# Patient Record
Sex: Female | Born: 1970 | Hispanic: Yes | Marital: Married
Health system: Midwestern US, Community
[De-identification: ages and names within clinical notes are randomized; demographics above are authoritative.]

## PROBLEM LIST (undated history)

## (undated) DIAGNOSIS — R519 Headache, unspecified: Secondary | ICD-10-CM

## (undated) DIAGNOSIS — N393 Stress incontinence (female) (male): Secondary | ICD-10-CM

## (undated) DIAGNOSIS — T4145XA Adverse effect of unspecified anesthetic, initial encounter: Secondary | ICD-10-CM

## (undated) DIAGNOSIS — T8859XA Other complications of anesthesia, initial encounter: Secondary | ICD-10-CM

## (undated) DIAGNOSIS — K219 Gastro-esophageal reflux disease without esophagitis: Secondary | ICD-10-CM

## (undated) DIAGNOSIS — T7840XA Allergy, unspecified, initial encounter: Secondary | ICD-10-CM

## (undated) DIAGNOSIS — L237 Allergic contact dermatitis due to plants, except food: Secondary | ICD-10-CM

## (undated) DIAGNOSIS — N9489 Other specified conditions associated with female genital organs and menstrual cycle: Secondary | ICD-10-CM

## (undated) DIAGNOSIS — J45909 Unspecified asthma, uncomplicated: Secondary | ICD-10-CM

## (undated) DIAGNOSIS — I10 Essential (primary) hypertension: Secondary | ICD-10-CM

## (undated) DIAGNOSIS — R51 Headache: Secondary | ICD-10-CM

## (undated) DIAGNOSIS — E559 Vitamin D deficiency, unspecified: Secondary | ICD-10-CM

## (undated) HISTORY — DX: Essential (primary) hypertension: I10

## (undated) HISTORY — DX: Unspecified asthma, uncomplicated: J45.909

## (undated) HISTORY — PX: OTHER SURGICAL HISTORY: SHX169

## (undated) HISTORY — DX: Allergy, unspecified, initial encounter: T78.40XA

## (undated) HISTORY — DX: Vitamin D deficiency, unspecified: E55.9

---

## 2001-01-18 ENCOUNTER — Emergency Department (HOSPITAL_COMMUNITY): Admission: EM | Admit: 2001-01-18 | Discharge: 2001-01-18 | Payer: Self-pay

## 2002-07-01 ENCOUNTER — Encounter: Payer: Self-pay | Admitting: Emergency Medicine

## 2002-07-01 ENCOUNTER — Emergency Department (HOSPITAL_COMMUNITY): Admission: EM | Admit: 2002-07-01 | Discharge: 2002-07-01 | Payer: Self-pay | Admitting: Emergency Medicine

## 2005-04-13 ENCOUNTER — Ambulatory Visit (HOSPITAL_COMMUNITY): Admission: RE | Admit: 2005-04-13 | Discharge: 2005-04-13 | Payer: Self-pay | Admitting: *Deleted

## 2005-04-30 ENCOUNTER — Ambulatory Visit (HOSPITAL_COMMUNITY): Admission: RE | Admit: 2005-04-30 | Discharge: 2005-04-30 | Payer: Self-pay | Admitting: *Deleted

## 2005-05-15 ENCOUNTER — Ambulatory Visit: Payer: Self-pay | Admitting: *Deleted

## 2005-06-18 ENCOUNTER — Ambulatory Visit (HOSPITAL_COMMUNITY): Admission: RE | Admit: 2005-06-18 | Discharge: 2005-06-18 | Payer: Self-pay | Admitting: *Deleted

## 2005-09-08 ENCOUNTER — Ambulatory Visit: Payer: Self-pay | Admitting: Obstetrics & Gynecology

## 2005-09-08 ENCOUNTER — Inpatient Hospital Stay (HOSPITAL_COMMUNITY): Admission: RE | Admit: 2005-09-08 | Discharge: 2005-09-11 | Payer: Self-pay | Admitting: *Deleted

## 2006-01-07 ENCOUNTER — Encounter (INDEPENDENT_AMBULATORY_CARE_PROVIDER_SITE_OTHER): Payer: Self-pay | Admitting: Obstetrics & Gynecology

## 2006-01-07 ENCOUNTER — Ambulatory Visit: Payer: Self-pay | Admitting: Obstetrics & Gynecology

## 2006-04-15 ENCOUNTER — Ambulatory Visit: Payer: Self-pay | Admitting: Obstetrics and Gynecology

## 2007-02-23 ENCOUNTER — Encounter: Payer: Self-pay | Admitting: Obstetrics & Gynecology

## 2007-02-23 ENCOUNTER — Ambulatory Visit: Payer: Self-pay | Admitting: Obstetrics and Gynecology

## 2007-08-25 ENCOUNTER — Encounter: Payer: Self-pay | Admitting: Obstetrics and Gynecology

## 2007-08-25 ENCOUNTER — Ambulatory Visit: Payer: Self-pay | Admitting: Obstetrics and Gynecology

## 2008-07-06 HISTORY — PX: LIPOSUCTION TRUNK: SUR833

## 2008-07-06 HISTORY — PX: REDUCTION MAMMAPLASTY: SUR839

## 2010-11-18 NOTE — Group Therapy Note (Signed)
[name], [name] NO.:  [id number]   MEDICAL RECORD NO.:  [id number]          PATIENT TYPE:  WOC   LOCATION:  [facility]                   FACILITY:  [facility]   PHYSICIAN:  Caren Griffins, CNM       DATE OF BIRTH:  [birth year]   DATE OF SERVICE:                                  CLINIC NOTE   REASON FOR VISIT:  Follow up Pap.   HISTORY:  This is a 40 year old G3 P3 who is here for her final q.6-  month Pap followup following an abnormal Pap on March 10, 2005, which  is ASC-H.  She did have a colposcopy May 15, 2005.  See previous  note by Dr. Okey Dupre.  She is concerned today with intermittent vaginal  itching.  She associates this with onset of menses, and also notices it  after intercourse.  It has been intermittent for about the past 5  months.  She denies dyspareunia.  She states the discharge is thick and  white, and of note, last night she did insert Monistat vaginal cream.  This has been ineffective in the past.  LMP was July 26, 2007.  She  has a Mirena IUD, and his happy with that.  She does not plan to have  anymore children.   Her interval surgical, family and medical history are unchanged from  that noted on November 2006.  She is on no other medications.  She does  state that she had A-1 gestational diabetes with her 2nd pregnancy.   PHYSICAL EXAMINATION:  VITAL SIGNS:  Unremarkable, except weight is 159  pounds.  Height is 5 feet 1 inch.  BMI is slightly over 30.  GENERAL:  WNWD NAD.  HEENT:  WNL.  NECK:  Without thyromegaly.  LUNGS:  Clear to auscultation bilaterally.  HEART:  RRR without murmur.  ABDOMEN:  Soft, flat and nontender without masses.  BREASTS:  No discrete masses noted.  No nipple discharge.  No  lymphadenopathy.  EXTREMITIES:  Two plus pulses.  No edema.  PELVIC EXAM:  Tanner 5, NEFG.  There is some thick white discharge, but  this is obscured by the Monistat cream, which is in the vault.  Pap, GC  and chlamydia are done.   Wet prep is done, but difficult to determine if  there are any candidal forms present due to the Monistat cream.   ASSESSMENT:  1. Overweight.  2. History of gestational diabetes.  3. History of abnormal Pap with previous 3 Pap smears normal.  4. Candidal vulva vaginitis.   PLAN:  1. Discussed diet, exercise, weight reduction.  Would suggest coming      in for a 2-hour OGTT at some point after she tries weight loss.  2. Pap smear is sent.  If this one is normal, she will not need      another followup Pap for a year.  3. She was given a prescription for Doxaphene 150 mg to take 1 now and      repeat every other day twice if still needed.           ______________________________  Caren Griffins, CNM  DP/MEDQ  D:  08/25/2007  T:  08/26/2007  Job:  [id number]

## 2010-11-18 NOTE — Group Therapy Note (Signed)
NAMESAROYA, RICCOBONO NO.:  [id number]   MEDICAL RECORD NO.:  [id number]          PATIENT TYPE:  WOC   LOCATION:  [facility]                   FACILITY:  [facility]   PHYSICIAN:  Argentina Donovan, MD        DATE OF BIRTH:  June 04, 1971   DATE OF SERVICE:  02/23/2007                                  CLINIC NOTE   CHIEF COMPLAINT:  Followup abnormal Pap smear.   HPI:  Patient is a 40 year old female who presents for followup history  of abnormal Pap smears.  Of note, patient has a history of azygos in  August of 2005, a normal Pap smear April, 2006, ASC-H in September,  2006, a repeat Pap smear in July of 2007 was within normal limits and  she also had one in October of 2007 that was within normal limits.  After the Pap smear done in September of 2006, which showed an ASC-H she  had a colposcopy with biopsy and endocervical curettage which on biopsy  showed severe erosive cervicitis with florid reactive/recurrent changes  and also severe endocervicitis.  As I just noted, her Pap smears since  then have been within normal limits.  She is here today for a repeat Pap  smear.   PHYSICAL EXAM:  VITAL SIGNS:  Temperature 97.9, pulse 63, blood pressure  123/83, weight 155.6, height 5 feet 1-1/2 inches.  GENERAL:  In no acute distress.  PELVIC:  No external lesions noted, no blood or discharge in the vaginal  vault or coming from the cervical os.  No lesions noted on examination  of cervix.  Patient's IUD strings were noted and appear in the normal  position.  Pap smear was obtained and sent.  Bimanual exam elicited no  cervical motion tenderness or adnexal tenderness.  No masses.   ASSESSMENT AND PLAN:  This is a 40 year old female with history of ASC-  H.  Will repeat every 6 month Pap smears through 2 years and at that  point if they are normal just follow up yearly.  Will follow up the  results of her Pap smear from today.     ______________________________  Argentina Donovan,  MD    ______________________________  Argentina Donovan, MD    PR/MEDQ  D:  02/23/2007  T:  02/24/2007  Job:  [id number]

## 2010-11-21 NOTE — Group Therapy Note (Signed)
NAMEBRENETTA, [name].:  [id number]   MEDICAL RECORD Pruitt.:  [id number]          PATIENT TYPE:  WOC   LOCATION:  [facility]                   FACILITY:  [facility]   PHYSICIAN:  Dorthula Perfect, MD     DATE OF BIRTH:  27-Jun-1971   DATE OF SERVICE:  01/07/2006                                    CLINIC NOTE   HISTORY:  This 40 year old Hispanic female returns for evaluation.  Sometime  in the past, she had an abnormal Pap smear with a question of dysplasia.  She was seen here for colposcopy November 2006, which was performed by Dr.  Lauralee Evener.  His colposcopy note revealed the cervix to appear completely  normal.  Biopsies were apparently taken from 3, 6, and 9 o'clock, all of  which revealed severe erosive surface cervicitis and reparative changes.  The endocervical curettings were benign with severe endocervicitis.  She was  then treated with doxycycline 100 mg b.i.d. for two weeks.  She returns  today for followup.   History is negative.   EXAMINATION:  Examination of the abdomen is completely normal.  Pelvic  examination is likewise normal.  The cervix appears normal with  endocervicitis from about 5 to 7 o'clock.  Pap smear is taken with the  plastic spatula and the Cytobrush was also used.   DISPOSITION:  Severe cervical cervicitis.  Pap smear 2.  The patient is told  that if this Pap smear is normal, that she should have another one in  three months.  Otherwise, if this is abnormal, she will be given  instructions as to what to do next.           ______________________________  Dorthula Perfect, MD     ER/MEDQ  D:  01/07/2006  T:  01/07/2006  Job:  [id number]

## 2010-11-21 NOTE — Op Note (Signed)
[name], [name]           ACCOUNT NO.:  [id number]   MEDICAL RECORD NO.:  [id number]          PATIENT TYPE:  INP   LOCATION:  9143                          [facility]   PHYSICIAN:  [name], [name] DATE OF BIRTH:  09/19/1970   DATE OF PROCEDURE:  09/08/2005  DATE OF DISCHARGE:                                 OPERATIVE REPORT   PREOPERATIVE DIAGNOSES:  1.  Intrauterine pregnancy at 39 weeks.  2.  History of severe laceration with her last delivery that required      surgery and bladder suspension.   POSTOPERATIVE DIAGNOSIS:  1.  Intrauterine pregnancy at 39 weeks.  2.  History of severe laceration with her last delivery that required      surgery and bladder suspension.   PROCEDURE:  Primary low transverse cesarean section via Pfannenstiel.   SURGEON:  [name], [name]   ASSISTANT:  Carolanne Grumbling, [name]   ANESTHESIA:  Spinal.   COMPLICATIONS:  None.   ESTIMATED BLOOD LOSS:  600 mL.   INTRAVENOUS FLUIDS:  3400 mL of lactated Ringer's.   URINE OUTPUT:  70 mL of clear urine.   INDICATIONS:  The patient is a 40 year old G4, P 2-0-1-2 at 44 weeks'  gestational age dated by LMP who presents for scheduled primary cesarean  section.  With the patient's second vaginal delivery she had what sounds to  be a fourth degree laceration that required vaginal surgery, and she also  suffered from incontinence that required bladder suspension.  The patient  had been counseled that she should not have another vaginal delivery.  The  patient was once again counseled on risks and benefits of cesarean section.  It was stressed the patient that even with her having cesarean section that  does not ensure that she will not have trouble with incontinence. The  patient expressed comprehension and wanted to proceed with the surgery.   FINDINGS:  Viable female infant in the cephalic presentation with Apgars of  9/9, weight 7 pounds 5 ounces.  Normal uterus with a small,  approximately 1  cm anterior fibroid, normal tubes and ovaries.   DESCRIPTION OF PROCEDURE:  The patient was taken to the operating room where  spinal anesthesia was placed and found to be adequate.  She was then prepped  and draped in the normal sterile fashion in the dorsal supine position with  a leftward tilt.  A Pfannenstiel skin incision was then made with the  scalpel and carried through to the underlying layer of fascia.  The fascia  was nicked in the midline and incision extended laterally with the Mayo  scissors.  This inferior aspect of the fascial incision was then grasped  with Kocher clamps, elevating the underlying rectus muscles dissected off  bluntly.  Attention was then turned to the superior aspect of the incision  which, in a similar fashion, was grasped, tented up with Kocher clamps and  the rectus muscles dissected off bluntly. The rectus muscles were separated  in midline, and the peritoneum identified, tented up and bluntly entered and  the incision was excised with Metzenbaum scissors with  good visualization of  the bladder. Bladder blade was inserted, and the vesicouterine perineum  identified, grasped with pickups and entered sharply with Metzenbaum  scissors.  Incision was extended laterally and a bladder flap created  digitally.   Bladder blade was then reinserted and the lower uterine segment incised in  transverse fashion with the scalpel.  The uterine incision was extended  laterally with the bandage scissors.  Bladder blade was removed.  The  infant's head was through delivered atraumatically.  Nose and mouth were  bulb suctioned and the cord clamped and cut. Infant was handed off to  waiting neonatologist.   Placenta was then removed manually, and the uterus exteriorized and cleared  of all clot and debris.  The uterine incision was repaired with O Vicryl in  a running locked fashion. There was a minimal amount of bleeding from the  right angle so a  figure-of-eight was placed, and excellent hemostasis was  obtained.  The uterus was put back in the abdominal cavity and the gutters  were irrigated.  The fascia was reapproximated with O Vicryl in  a running  fashion. The skin was closed with staples.   The patient tolerated the procedure well. Sponge, lap and needle counts were  correct x2. Ancef was given at cord clamp. The patient taken recovery room  in stable condition     ______________________________  [name], [name]    ______________________________  [name], [name]    TLW/MEDQ  D:  09/08/2005  T:  09/09/2005  Job:  [id number]

## 2010-11-21 NOTE — Discharge Summary (Signed)
[name], [name]           ACCOUNT NO.:  [id number]   MEDICAL RECORD NO.:  [id number]          PATIENT TYPE:  INP   LOCATION:  9143                          [facility]   PHYSICIAN:  [name], [name] DATE OF BIRTH:  1971-04-02   DATE OF ADMISSION:  09/08/2005  DATE OF DISCHARGE:  09/11/2005                                 DISCHARGE SUMMARY   DISCHARGE DIAGNOSES:  Low transverse cesarean section.   DISCHARGE MEDICATIONS:  1.  Ibuprofen 600 mg q.6h. p.r.n. pain.  2.  Percocet 5/325 mg one tablet q.6h. p.r.n. pain.  3.  Colace one tablet b.i.d. p.r.n. constipation.  4.  Prenatal vitamins one tablet daily while breast-feeding.   OPERATION/PROCEDURE:  Primary low transverse cesarean section performed on  September 08, 2005.  Please see op note for details.   LABORATORY DATA:  Postoperative CBC on September 09, 2005 showed WBC 12.5,  hemoglobin 11.4, hematocrit 33.3, and platelets 154,000.   HOSPITAL COURSE:  Patient is a 40 year old G4, P2-0-1-2 who presented at  term for a primary elective low transverse cesarean section on September 08, 2005.  Her surgery was performed by Dr. Penne Lash and Dr. Mayford Knife and  produced a viable 7 pound 5 ounce female with Apgars of 9 at one minute and  9 at five minutes (please see op note for details of cesarean section).  Following her procedure the patient was transferred to the floor where she  did quite well.  Her pain was adequately controlled with ibuprofen and  Percocet.  She decided she was going to breast-feed her little girl and for  contraception she would have an IUD placed at six weeks ___________  possibility of pregnancy prior to her IUD insertion at six weeks and that  she was going to engage in sexual intercourse she needed another form of  birth control.  The patient expressed understanding of these risks and  stated she would wait until her IUD insertion at six weeks to start sexual  intercourse.  Patient's blood type was O+ so a  vaccine was not treated prior  to discharge.  The patient continued to ___________ about complication.  Her  staples were removed prior to discharge without complications at six weeks  at [facility].  The patient was discharged in stable condition.  Of  note, the infant was delivered at 9:54 a.m. on the morning of September 08, 2005  and placenta was delivered at 9:55 on the morning of September 08, 2005.      Whitney Post, [name]    ______________________________  [name], [name]    KF/MEDQ  D:  09/11/2005  T:  09/11/2005  Job:  [id number]

## 2012-06-07 ENCOUNTER — Emergency Department (HOSPITAL_BASED_OUTPATIENT_CLINIC_OR_DEPARTMENT_OTHER)
Admission: EM | Admit: 2012-06-07 | Discharge: 2012-06-07 | Disposition: A | Discharge status: Home / Self Care | Payer: Medicaid Other | Attending: Emergency Medicine | Admitting: Emergency Medicine

## 2012-06-07 ENCOUNTER — Encounter (HOSPITAL_BASED_OUTPATIENT_CLINIC_OR_DEPARTMENT_OTHER): Payer: Self-pay | Admitting: *Deleted

## 2012-06-07 DIAGNOSIS — N949 Unspecified condition associated with female genital organs and menstrual cycle: Secondary | ICD-10-CM | POA: Insufficient documentation

## 2012-06-07 DIAGNOSIS — N760 Acute vaginitis: Secondary | ICD-10-CM

## 2012-06-07 DIAGNOSIS — N938 Other specified abnormal uterine and vaginal bleeding: Secondary | ICD-10-CM | POA: Insufficient documentation

## 2012-06-07 DIAGNOSIS — B9689 Other specified bacterial agents as the cause of diseases classified elsewhere: Secondary | ICD-10-CM

## 2012-06-07 DIAGNOSIS — R109 Unspecified abdominal pain: Secondary | ICD-10-CM | POA: Insufficient documentation

## 2012-06-07 LAB — CBC
HCT: 34.9 % — ABNORMAL LOW (ref 36.0–46.0)
Hemoglobin: 12 g/dL (ref 12.0–15.0)
MCH: 29.7 pg (ref 26.0–34.0)
MCHC: 34.4 g/dL (ref 30.0–36.0)
MCV: 86.4 fL (ref 78.0–100.0)
Platelets: 200 10*3/uL (ref 150–400)
RBC: 4.04 MIL/uL (ref 3.87–5.11)
RDW: 12.5 % (ref 11.5–15.5)
WBC: 9.2 10*3/uL (ref 4.0–10.5)

## 2012-06-07 LAB — URINALYSIS, ROUTINE W REFLEX MICROSCOPIC
Bilirubin Urine: NEGATIVE
Glucose, UA: NEGATIVE mg/dL
Ketones, ur: NEGATIVE mg/dL
Leukocytes, UA: NEGATIVE
Nitrite: NEGATIVE
Protein, ur: NEGATIVE mg/dL
Specific Gravity, Urine: 1.024 (ref 1.005–1.030)
Urobilinogen, UA: 1 mg/dL (ref 0.0–1.0)
pH: 5.5 (ref 5.0–8.0)

## 2012-06-07 LAB — URINE MICROSCOPIC-ADD ON

## 2012-06-07 LAB — WET PREP, GENITAL
Trich, Wet Prep: NONE SEEN
Yeast Wet Prep HPF POC: NONE SEEN

## 2012-06-07 LAB — PREGNANCY, URINE: Preg Test, Ur: NEGATIVE

## 2012-06-07 MED ORDER — METRONIDAZOLE 500 MG PO TABS: 500.0000 mg | ORAL_TABLET | Freq: Two times a day (BID) | ORAL | Status: DC

## 2012-06-07 NOTE — ED Notes (Signed)
6 month history of "period problems" states has more lower abdominal pain than before and flow changes some times barely spots sometimes bleeds more heavily and they have become more irregular. States she has an IUD that she has had for 6 years. Reports a "heavy feeling" in pelvic area

## 2012-06-07 NOTE — ED Provider Notes (Signed)
History     CSN: [id number]  Arrival date & time 06/07/12  1014   First MD Initiated Contact with Patient 06/07/12 1019      Chief Complaint  Patient presents with  . Menstrual Problem    (Consider location/radiation/quality/duration/timing/severity/associated sxs/prior treatment) The history is provided by the patient.  pt c/o irregular periods for past 5-6 months. States has had iud for past 6 years. Previously periods would last 3-4 days. Regular bleeding for 3 days, then taper off. In past 6 months, bleeding is irregular during periods, spotting at times, at other times heavy bleeding. On period now, v light bleeding. In past couple days notes cramping across lower abdomen bilaterally. No constant, focal pain. No back or flank pain. No dysuria, urgency or frequency. No fever or chills. Only prior abd surgery is c section. No abd distension. No vomiting. Having normal bms. Denies fever or chills. No vaginal discharge. Denies hx ovarian cysts, endometriosis or uterine fibroids.   History reviewed. No pertinent past medical history.  History reviewed. No pertinent past surgical history.  History reviewed. No pertinent family history.  History  Substance Use Topics  . Smoking status: Never Smoker   . Smokeless tobacco: Not on file  . Alcohol Use: No    OB History    Grav Para Term Preterm Abortions TAB SAB Ect Mult Living                  Review of Systems  Constitutional: Negative for fever and chills.  HENT: Negative for neck pain.   Eyes: Negative for redness.  Respiratory: Negative for shortness of breath.   Cardiovascular: Negative for chest pain.  Gastrointestinal: Positive for abdominal pain. Negative for vomiting, diarrhea and constipation.  Genitourinary: Negative for flank pain.  Musculoskeletal: Negative for back pain.  Skin: Negative for rash.  Neurological: Negative for headaches.  Hematological: Does not bruise/bleed easily.  Psychiatric/Behavioral:  Negative for confusion.    Allergies  Review of patient's allergies indicates no known allergies.  Home Medications  No current outpatient prescriptions on file.  BP 141/91  Pulse 79  Temp 98.2 F (36.8 C) (Oral)  Resp 20  SpO2 100%  LMP 06/07/2012  Physical Exam  Nursing note and vitals reviewed. Constitutional: She appears well-developed and well-nourished. No distress.  Eyes: Conjunctivae normal are normal. No scleral icterus.  Neck: Neck supple. No tracheal deviation present.  Cardiovascular: Normal rate.   Pulmonary/Chest: Effort normal. No respiratory distress.  Abdominal: Soft. Normal appearance and bowel sounds are normal. She exhibits no distension and no mass. There is no tenderness. There is no rebound and no guarding.       No hernias.   Genitourinary:       No cva tenderness. Small amount dark blood in vagina. No active bleeding. Cervix closed. iud string noted. No cmt, no focal adx masses or tenderness.   Musculoskeletal: She exhibits no edema.  Neurological: She is alert.  Skin: Skin is warm and dry. No rash noted.  Psychiatric: She has a normal mood and affect.    ED Course  Procedures (including critical care time)   Labs Reviewed  URINALYSIS, ROUTINE W REFLEX MICROSCOPIC  PREGNANCY, URINE  CBC   Results for orders placed during the hospital encounter of 06/07/12  URINALYSIS, ROUTINE W REFLEX MICROSCOPIC      Component Value Range   Color, Urine YELLOW  YELLOW   APPearance CLEAR  CLEAR   Specific Gravity, Urine 1.024  1.005 - 1.030  pH 5.5  5.0 - 8.0   Glucose, UA NEGATIVE  NEGATIVE mg/dL   Hgb urine dipstick LARGE (*) NEGATIVE   Bilirubin Urine NEGATIVE  NEGATIVE   Ketones, ur NEGATIVE  NEGATIVE mg/dL   Protein, ur NEGATIVE  NEGATIVE mg/dL   Urobilinogen, UA 1.0  0.0 - 1.0 mg/dL   Nitrite NEGATIVE  NEGATIVE   Leukocytes, UA NEGATIVE  NEGATIVE  PREGNANCY, URINE      Component Value Range   Preg Test, Ur NEGATIVE  NEGATIVE  CBC       Component Value Range   WBC 9.2  4.0 - 10.5 K/uL   RBC 4.04  3.87 - 5.11 MIL/uL   Hemoglobin 12.0  12.0 - 15.0 g/dL   HCT 44.0 (*) 10.2 - 72.5 %   MCV 86.4  78.0 - 100.0 fL   MCH 29.7  26.0 - 34.0 pg   MCHC 34.4  30.0 - 36.0 g/dL   RDW 36.6  44.0 - 34.7 %   Platelets 200  150 - 400 K/uL  WET PREP, GENITAL      Component Value Range   Yeast Wet Prep HPF POC NONE SEEN  NONE SEEN   Trich, Wet Prep NONE SEEN  NONE SEEN   Clue Cells Wet Prep HPF POC FEW (*) NONE SEEN   WBC, Wet Prep HPF POC FEW (*) NONE SEEN  URINE MICROSCOPIC-ADD ON      Component Value Range   Squamous Epithelial / LPF RARE  RARE   WBC, UA 0-2  <3 WBC/hpf   RBC / HPF 21-50  <3 RBC/hpf   Bacteria, UA FEW (*) RARE   Urine-Other MUCOUS PRESENT         MDM  Labs.   Reviewed nursing notes and prior charts for additional history.   Clue cells noted on wet prep, will rx flagyl.  hgb 12.  No active bleeding. Pt states has used 3 pads per day with current period.  abd soft nt.   Benign abd exam, afeb.  Pt appears stable for d/c.  Notes symptoms, issues w irregular periods/bleeding for 6 months - will refer to gyn follow up.            Suzi Roots, MD 06/07/12 1130

## 2012-06-08 LAB — GC/CHLAMYDIA PROBE AMP
CT Probe RNA: NEGATIVE
GC Probe RNA: NEGATIVE

## 2012-07-07 ENCOUNTER — Encounter: Payer: Self-pay | Admitting: Obstetrics & Gynecology

## 2012-07-07 ENCOUNTER — Ambulatory Visit (INDEPENDENT_AMBULATORY_CARE_PROVIDER_SITE_OTHER): Payer: Medicaid Other | Admitting: Obstetrics & Gynecology

## 2012-07-07 VITALS — BP 141/91 | HR 80 | Temp 97.5°F | Ht 61.0 in | Wt 150.6 lb

## 2012-07-07 DIAGNOSIS — Z3009 Encounter for other general counseling and advice on contraception: Secondary | ICD-10-CM

## 2012-07-07 DIAGNOSIS — N920 Excessive and frequent menstruation with regular cycle: Secondary | ICD-10-CM

## 2012-07-07 NOTE — Patient Instructions (Addendum)
Laparoscopic Tubal Ligation Laparoscopic tubal ligation is a procedure that closes the fallopian tubes at a time other than right after childbirth. By closing the fallopian tubes, the eggs that are released from the ovaries cannot enter the uterus and sperm cannot reach the egg. Tubal ligation is also known as getting your "tubes tied." Tubal ligation is done so you will not be able to get pregnant or have a baby.  Although this procedure may be reversed, it should be considered permanent and irreversible. If you want to have future pregnancies, you should not have this procedure.  LET YOUR CAREGIVER KNOW ABOUT:  Allergies to food or medicine.  Medicines taken, including vitamins, herbs, eyedrops, over-the-counter medicines, and creams.  Use of steroids (by mouth or creams).  Previous problems with numbing medicines.  History of bleeding problems or blood clots.  Any recent colds or infections.  Previous surgery.  Other health problems, including diabetes and kidney problems.  Possibility of pregnancy, if this applies.  Any past pregnancies. RISKS AND COMPLICATIONS   Infection.  Bleeding.  Injury to surrounding organs.  Anesthetic side effects.  Failure of the procedure.  Ectopic pregnancy.  Future regret about having the procedure done. BEFORE THE PROCEDURE  Do not take aspirin or blood thinners a week before the procedure or as directed. This can cause bleeding.  Do not eat or drink anything 6 to 8 hours before the procedure. PROCEDURE   You may be given a medicine to help you relax (sedative) before the procedure. You will be given a medicine to make you sleep (general anesthetic) during the procedure.  A tube will be put down your throat to help your breath while under general anesthesia.  Two small cuts (incisions) are made in the lower abdominal area and near the belly button.  Your abdominal area will be inflated with a safe gas (carbon dioxide). This helps  give the surgeon room to operate, visualize, and helps the surgeon avoid other organs.  A thin, lighted tube (laparoscope) with a camera attached is inserted into your abdomen through one of the incisions near the belly button. Other small instruments are also inserted through the other abdominal incision.  The fallopian tubes are located and are either blocked with a ring, clip, or are burned (cauterized).  After the fallopian tubes are blocked, the gas is released from the abdomen.  The incisions will be closed with stitches (sutures), and a bandage may be placed over the incisions. AFTER THE PROCEDURE   You will rest in a recovery room for 1 4 hours until you are stable and doing well.  You will also have some mild abdominal discomfort for 3 7 days. You will be given pain medicine to ease any discomfort.  As long as there are no problems, you may be allowed to go home. Someone will need to drive you home and be with you for at least 24 hours once home.  You may have some mild discomfort in the throat. This is from the tube placed in your throat while you were sleeping.  You may experience discomfort in the shoulder area from some trapped air between the liver and diaphragm. This sensation is normal and will slowly go away on its own. Document Released: 09/28/2000 Document Revised: 12/22/2011 Document Reviewed: 10/03/2011 [employer] Patient Information 2013 [employer].  

## 2012-07-07 NOTE — Progress Notes (Signed)
Patient ID: [name], female   DOB: [birth year], 42 y.o.   MRN: [id number]  Chief Complaint  Patient presents with  . Follow-up  . Metrorrhagia    HPI [name] is a 42 y.o. female.  [id number] Patient's last menstrual period was 07/01/2012. Increased menstrual flow and pain for the last 6 months. Paragard in place since 09/2005. No abnormal discharge or intermenstrual spotting. Menses last for 7 days, become heavy for 1 day. Wants to have tubal ligation, doesn't want another IUD or pills. HPI  Past Medical History  Diagnosis Date  . Asthma     Past Surgical History  Procedure Date  . Cesarean section   . Breast surgery 2010    breast reduction  . Liposuction trunk 2010    Family History  Problem Relation Age of Onset  . Heart disease Father   . Diabetes Sister   . Hypertension Sister   . Hypertension Brother     Social History History  Substance Use Topics  . Smoking status: Never Smoker   . Smokeless tobacco: Not on file  . Alcohol Use: No    No Known Allergies  No current outpatient prescriptions on file.    Review of Systems Review of Systems  Constitutional: Negative for appetite change.  Gastrointestinal: Positive for nausea (with pain). Negative for diarrhea and constipation.  Genitourinary: Positive for vaginal bleeding and menstrual problem. Negative for vaginal discharge, vaginal pain and dyspareunia.    Blood pressure 141/91, pulse 80, temperature 97.5 F (36.4 C), temperature source Oral, height 5\' 1"  (1.549 m), weight 150 lb 9.6 oz (68.312 kg), last menstrual period 07/01/2012.  Physical Exam Physical Exam  Constitutional: She appears well-developed and well-nourished. No distress.  Pulmonary/Chest: Effort normal. No respiratory distress.  Abdominal: Soft. She exhibits no distension and no mass.  Genitourinary: Vagina normal and uterus normal.       Paragard string visible 2-3 cm, no tenderness  Skin: Skin is warm and dry.    Psychiatric: She has a normal mood and affect. Her behavior is normal.    Data Reviewed ED visit  Assessment    Menorrhagia and dysmenorrhea with Paragard, desires sterilization    Plan    Sign Medicaid paper  Schedule LBTL. Procedure and risks explained and questions answered. Remove IUD in OR       [name] 07/07/2012, 4:27 PM

## 2012-07-12 ENCOUNTER — Other Ambulatory Visit: Payer: Self-pay | Admitting: Obstetrics & Gynecology

## 2012-08-16 ENCOUNTER — Encounter (HOSPITAL_COMMUNITY): Payer: Self-pay

## 2012-08-16 ENCOUNTER — Encounter (HOSPITAL_COMMUNITY)
Admission: RE | Admit: 2012-08-16 | Discharge: 2012-08-16 | Disposition: A | Discharge status: Home / Self Care | Payer: Medicaid Other | Source: Ambulatory Visit | Attending: Obstetrics & Gynecology | Admitting: Obstetrics & Gynecology

## 2012-08-16 DIAGNOSIS — N920 Excessive and frequent menstruation with regular cycle: Secondary | ICD-10-CM | POA: Diagnosis not present

## 2012-08-16 DIAGNOSIS — N946 Dysmenorrhea, unspecified: Secondary | ICD-10-CM | POA: Diagnosis not present

## 2012-08-16 DIAGNOSIS — Z302 Encounter for sterilization: Secondary | ICD-10-CM | POA: Diagnosis present

## 2012-08-16 DIAGNOSIS — Z30432 Encounter for removal of intrauterine contraceptive device: Secondary | ICD-10-CM | POA: Diagnosis not present

## 2012-08-16 LAB — CBC
HCT: 37.6 % (ref 36.0–46.0)
Hemoglobin: 12.4 g/dL (ref 12.0–15.0)
MCH: 29.2 pg (ref 26.0–34.0)
MCHC: 33 g/dL (ref 30.0–36.0)
MCV: 88.7 fL (ref 78.0–100.0)
Platelets: 221 10*3/uL (ref 150–400)
RBC: 4.24 MIL/uL (ref 3.87–5.11)
RDW: 12.8 % (ref 11.5–15.5)
WBC: 8.9 10*3/uL (ref 4.0–10.5)

## 2012-08-16 LAB — SURGICAL PCR SCREEN
MRSA, PCR: NEGATIVE
Staphylococcus aureus: NEGATIVE

## 2012-08-16 NOTE — Patient Instructions (Addendum)
Your procedure is scheduled on:08/22/12  Enter through the Main Entrance at :0930 am Pick up desk phone and dial [phone] and inform us of your arrival.  Please call [phone] if you have any problems the morning of surgery.  Remember: Do not eat or drink after midnight:SUNDAY   Take these meds the morning of surgery with a sip of water:none  DO NOT wear jewelry, eye make-up, lipstick,body lotion, or dark fingernail polish. Do not shave for 48 hours prior to surgery.  If you are to be admitted after surgery, leave suitcase in car until your room has been assigned. Patients discharged on the day of surgery will not be allowed to drive home.

## 2012-08-22 ENCOUNTER — Ambulatory Visit (HOSPITAL_COMMUNITY): Payer: Medicaid Other | Admitting: Anesthesiology

## 2012-08-22 ENCOUNTER — Encounter (HOSPITAL_COMMUNITY): Payer: Self-pay | Admitting: Anesthesiology

## 2012-08-22 ENCOUNTER — Encounter (HOSPITAL_COMMUNITY)
Admission: RE | Disposition: A | Discharge status: Home / Self Care | Payer: Self-pay | Source: Ambulatory Visit | Attending: Obstetrics & Gynecology

## 2012-08-22 ENCOUNTER — Ambulatory Visit (HOSPITAL_COMMUNITY)
Admission: RE | Admit: 2012-08-22 | Discharge: 2012-08-22 | Disposition: A | Discharge status: Home / Self Care | Payer: Medicaid Other | Source: Ambulatory Visit | Attending: Obstetrics & Gynecology | Admitting: Obstetrics & Gynecology

## 2012-08-22 DIAGNOSIS — Z30432 Encounter for removal of intrauterine contraceptive device: Secondary | ICD-10-CM | POA: Diagnosis not present

## 2012-08-22 DIAGNOSIS — N920 Excessive and frequent menstruation with regular cycle: Secondary | ICD-10-CM | POA: Insufficient documentation

## 2012-08-22 DIAGNOSIS — N946 Dysmenorrhea, unspecified: Secondary | ICD-10-CM | POA: Insufficient documentation

## 2012-08-22 DIAGNOSIS — Z302 Encounter for sterilization: Secondary | ICD-10-CM | POA: Insufficient documentation

## 2012-08-22 HISTORY — PX: LAPAROSCOPIC TUBAL LIGATION: SHX1937

## 2012-08-22 LAB — PREGNANCY, URINE: Preg Test, Ur: NEGATIVE

## 2012-08-22 SURGERY — LIGATION, FALLOPIAN TUBE, LAPAROSCOPIC
Anesthesia: General | Site: Abdomen | Laterality: Bilateral | Wound class: Clean Contaminated

## 2012-08-22 MED ORDER — DEXAMETHASONE SODIUM PHOSPHATE 10 MG/ML IJ SOLN
INTRAMUSCULAR | Status: AC
  Filled 2012-08-22: qty 1

## 2012-08-22 MED ORDER — NEOSTIGMINE METHYLSULFATE 1 MG/ML IJ SOLN
INTRAMUSCULAR | Status: AC
  Filled 2012-08-22: qty 1

## 2012-08-22 MED ORDER — GLYCOPYRROLATE 0.2 MG/ML IJ SOLN
INTRAMUSCULAR | Status: DC | PRN
  Administered 2012-08-22: 0.4 mg via INTRAVENOUS

## 2012-08-22 MED ORDER — ROCURONIUM BROMIDE 50 MG/5ML IV SOLN
INTRAVENOUS | Status: AC
  Filled 2012-08-22: qty 1

## 2012-08-22 MED ORDER — LACTATED RINGERS IV SOLN
INTRAVENOUS | Status: DC
  Administered 2012-08-22 (×2): via INTRAVENOUS

## 2012-08-22 MED ORDER — MEPERIDINE HCL 25 MG/ML IJ SOLN: 6.2500 mg | INTRAMUSCULAR | Status: DC | PRN

## 2012-08-22 MED ORDER — MIDAZOLAM HCL 2 MG/2ML IJ SOLN: 0.5000 mg | Freq: Once | INTRAMUSCULAR | Status: DC | PRN

## 2012-08-22 MED ORDER — KETOROLAC TROMETHAMINE 30 MG/ML IJ SOLN
INTRAMUSCULAR | Status: AC
  Filled 2012-08-22: qty 1

## 2012-08-22 MED ORDER — ONDANSETRON HCL 4 MG/2ML IJ SOLN
INTRAMUSCULAR | Status: DC | PRN
  Administered 2012-08-22: 4 mg via INTRAVENOUS

## 2012-08-22 MED ORDER — LIDOCAINE HCL (CARDIAC) 20 MG/ML IV SOLN
INTRAVENOUS | Status: DC | PRN
  Administered 2012-08-22 (×2): 30 mg via INTRAVENOUS

## 2012-08-22 MED ORDER — KETOROLAC TROMETHAMINE 30 MG/ML IJ SOLN: 15.0000 mg | Freq: Once | INTRAMUSCULAR | Status: DC | PRN

## 2012-08-22 MED ORDER — FENTANYL CITRATE 0.05 MG/ML IJ SOLN
INTRAMUSCULAR | Status: AC
  Administered 2012-08-22: 50 ug via INTRAVENOUS
  Filled 2012-08-22: qty 2

## 2012-08-22 MED ORDER — HYDROCODONE-ACETAMINOPHEN 5-500 MG PO TABS: 1.0000 | ORAL_TABLET | Freq: Four times a day (QID) | ORAL | Status: DC | PRN

## 2012-08-22 MED ORDER — FENTANYL CITRATE 0.05 MG/ML IJ SOLN
INTRAMUSCULAR | Status: DC | PRN
  Administered 2012-08-22 (×5): 50 ug via INTRAVENOUS

## 2012-08-22 MED ORDER — DEXAMETHASONE SODIUM PHOSPHATE 4 MG/ML IJ SOLN
INTRAMUSCULAR | Status: DC | PRN
  Administered 2012-08-22: 10 mg via INTRAVENOUS

## 2012-08-22 MED ORDER — LACTATED RINGERS IV SOLN
INTRAVENOUS | Status: DC
  Administered 2012-08-22: 13:00:00 via INTRAVENOUS

## 2012-08-22 MED ORDER — PROPOFOL 10 MG/ML IV EMUL
INTRAVENOUS | Status: AC
  Filled 2012-08-22: qty 20

## 2012-08-22 MED ORDER — ONDANSETRON HCL 4 MG/2ML IJ SOLN
INTRAMUSCULAR | Status: AC
  Filled 2012-08-22: qty 2

## 2012-08-22 MED ORDER — MIDAZOLAM HCL 5 MG/5ML IJ SOLN
INTRAMUSCULAR | Status: DC | PRN
  Administered 2012-08-22: 2 mg via INTRAVENOUS

## 2012-08-22 MED ORDER — LIDOCAINE HCL (CARDIAC) 20 MG/ML IV SOLN
INTRAVENOUS | Status: AC
  Filled 2012-08-22: qty 5

## 2012-08-22 MED ORDER — BUPIVACAINE HCL (PF) 0.25 % IJ SOLN
INTRAMUSCULAR | Status: DC | PRN
  Administered 2012-08-22: 10 mL

## 2012-08-22 MED ORDER — ROCURONIUM BROMIDE 100 MG/10ML IV SOLN
INTRAVENOUS | Status: DC | PRN
  Administered 2012-08-22: 25 mg via INTRAVENOUS

## 2012-08-22 MED ORDER — FENTANYL CITRATE 0.05 MG/ML IJ SOLN
INTRAMUSCULAR | Status: AC
  Filled 2012-08-22: qty 5

## 2012-08-22 MED ORDER — NEOSTIGMINE METHYLSULFATE 1 MG/ML IJ SOLN
INTRAMUSCULAR | Status: DC | PRN
  Administered 2012-08-22: 2 mg via INTRAVENOUS

## 2012-08-22 MED ORDER — GLYCOPYRROLATE 0.2 MG/ML IJ SOLN
INTRAMUSCULAR | Status: AC
  Filled 2012-08-22: qty 2

## 2012-08-22 MED ORDER — BUPIVACAINE HCL (PF) 0.25 % IJ SOLN
INTRAMUSCULAR | Status: AC
  Filled 2012-08-22: qty 30

## 2012-08-22 MED ORDER — FENTANYL CITRATE 0.05 MG/ML IJ SOLN
25.0000 ug | INTRAMUSCULAR | Status: DC | PRN
  Administered 2012-08-22: 50 ug via INTRAVENOUS

## 2012-08-22 MED ORDER — PROMETHAZINE HCL 25 MG/ML IJ SOLN: 6.2500 mg | INTRAMUSCULAR | Status: DC | PRN

## 2012-08-22 MED ORDER — KETOROLAC TROMETHAMINE 30 MG/ML IJ SOLN
INTRAMUSCULAR | Status: DC | PRN
  Administered 2012-08-22 (×2): 30 mg via INTRAVENOUS

## 2012-08-22 MED ORDER — PROPOFOL 10 MG/ML IV EMUL
INTRAVENOUS | Status: DC | PRN
  Administered 2012-08-22: 160 mg via INTRAVENOUS

## 2012-08-22 MED ORDER — MIDAZOLAM HCL 2 MG/2ML IJ SOLN
INTRAMUSCULAR | Status: AC
  Filled 2012-08-22: qty 2

## 2012-08-22 SURGICAL SUPPLY — 21 items
BENZOIN TINCTURE PRP APPL 2/3 (GAUZE/BANDAGES/DRESSINGS) IMPLANT
CATH ROBINSON RED A/P 16FR (CATHETERS) ×2 IMPLANT
CHLORAPREP W/TINT 26ML (MISCELLANEOUS) ×2 IMPLANT
CLIP FILSHIE TUBAL LIGA STRL (Clip) ×4 IMPLANT
CLOTH BEACON ORANGE TIMEOUT ST (SAFETY) ×2 IMPLANT
DERMABOND ADHESIVE PROPEN (GAUZE/BANDAGES/DRESSINGS) ×1
DERMABOND ADVANCED .7 DNX6 (GAUZE/BANDAGES/DRESSINGS) ×1 IMPLANT
GLOVE BIO SURGEON STRL SZ 6.5 (GLOVE) ×2 IMPLANT
GLOVE BIOGEL PI IND STRL 7.0 (GLOVE) ×2 IMPLANT
GLOVE BIOGEL PI INDICATOR 7.0 (GLOVE) ×2
GOWN PREVENTION PLUS LG XLONG (DISPOSABLE) ×4 IMPLANT
NEEDLE INSUFFLATION 120MM (ENDOMECHANICALS) ×2 IMPLANT
PACK LAPAROSCOPY BASIN (CUSTOM PROCEDURE TRAY) ×2 IMPLANT
SET IRRIG TUBING LAPAROSCOPIC (IRRIGATION / IRRIGATOR) IMPLANT
SUT VICRYL 0 UR6 27IN ABS (SUTURE) ×2 IMPLANT
SUT VICRYL 4-0 PS2 18IN ABS (SUTURE) ×2 IMPLANT
TOWEL OR 17X24 6PK STRL BLUE (TOWEL DISPOSABLE) ×4 IMPLANT
TROCAR BALLN 12MMX100 BLUNT (TROCAR) ×2 IMPLANT
TROCAR XCEL DIL TIP R 11M (ENDOMECHANICALS) ×2 IMPLANT
WARMER LAPAROSCOPE (MISCELLANEOUS) ×2 IMPLANT
WATER STERILE IRR 1000ML POUR (IV SOLUTION) ×2 IMPLANT

## 2012-08-22 NOTE — Anesthesia Preprocedure Evaluation (Signed)
Anesthesia Evaluation  Patient identified by MRN, date of birth, ID band Patient awake    Reviewed: Allergy & Precautions, H&P , Patient's Chart, lab work & pertinent test results, reviewed documented beta blocker date and time   History of Anesthesia Complications Negative for: history of anesthetic complications  Airway Mallampati: II TM Distance: >3 FB Neck ROM: full    Dental no notable dental hx.    Pulmonary neg pulmonary ROS, asthma ,  breath sounds clear to auscultation  Pulmonary exam normal       Cardiovascular Exercise Tolerance: Good negative cardio ROS  Rhythm:regular Rate:Normal     Neuro/Psych negative neurological ROS  negative psych ROS   GI/Hepatic negative GI ROS, Neg liver ROS,   Endo/Other  negative endocrine ROS  Renal/GU negative Renal ROS     Musculoskeletal   Abdominal   Peds  Hematology negative hematology ROS (+)   Anesthesia Other Findings   Reproductive/Obstetrics negative OB ROS                           Anesthesia Physical Anesthesia Plan  ASA: II  Anesthesia Plan: General ETT   Post-op Pain Management:    Induction:   Airway Management Planned:   Additional Equipment:   Intra-op Plan:   Post-operative Plan:   Informed Consent: I have reviewed the patients History and Physical, chart, labs and discussed the procedure including the risks, benefits and alternatives for the proposed anesthesia with the patient or authorized representative who has indicated his/her understanding and acceptance.   Dental Advisory Given  Plan Discussed with: CRNA and Surgeon  Anesthesia Plan Comments:         Anesthesia Quick Evaluation

## 2012-08-22 NOTE — Transfer of Care (Signed)
Immediate Anesthesia Transfer of Care Note  Patient: [name]  Procedure(s) Performed: Procedure(s) with comments: LAPAROSCOPIC TUBAL LIGATION (Bilateral) - IUD removal  Patient Location: PACU  Anesthesia Type:General  Level of Consciousness: awake, alert  and oriented  Airway & Oxygen Therapy: Patient Spontanous Breathing and Patient connected to nasal cannula oxygen  Post-op Assessment: Report given to PACU RN and Post -op Vital signs reviewed and stable  Post vital signs: Reviewed and stable  Complications: No apparent anesthesia complications

## 2012-08-22 NOTE — Op Note (Signed)
Procedure: Laparoscopic bilateral tubal ligation with Filshie clips, removal of ParaGard intrauterine device Preoperative diagnosis: Undesired fertility with intrauterine device in place Postoperative diagnosis: Tubal sterilization, left adnexal adhesions Surgeon: Dr. Scheryl Darter Anesthesia: Gen. endotracheal Specimens: None Drains: None Complications: None Counts: Correct   Gave written consent for removal of her intrauterine device and bilateral tubal ligation. The procedure was scheduled to be performed laparoscopically. Patient identification was confirmed and she was brought to the operating room and general anesthesia was induced. His placed in dorsal lithotomy position. Abdomen perineum and vagina were sterilely prepped and draped. The bladder was drained with red rubber catheter. A Hulka tenaculum was placed on the cervix. Quarter percent Marcaine was infiltrated at the umbilicus. 11 blade was used to make a transverse infraumbilical skin incision site of previous scars secondary to abdominoplasty. In place the Veress needle blood proper placement cannot be assured. The fascia and peritoneum were opened with curved Mayo scissors and the [facility] trocar was placed. Pneumoperitoneum was achieved. Laparoscope was inserted with video camera and use. Some of the abdominal wall adhesions. The right tube and ovary appeared normal. The left tube and ovary had chronic adhesions. Could be identified on the left. A Filshie clip was placed with good positioning seen on the right fallopian tube. The left tube was identified and and Filshie clip was placed in the proximal portion with good positioning seen. Elected not to perform extensive lysis adhesions on the left. All instruments were removed the pneumoperitoneum was relieved. Fascia was closed with 0 Vicryl. Skin was closed with interrupted sutures with 4-0 Vicryl and Dermabond was applied. Patient tolerated the procedure well without complications. She was  brought to condition to PACU.  Dr. Scheryl Darter 08/22/2012 12:33 PM

## 2012-08-22 NOTE — H&P (Signed)
  Patient ID: [name], female DOB: [birth year], 42 y.o. MRN: [id number]  Chief Complaint   Patient presents with   .  Follow-up   .  Metrorrhagia   HPI  [name] is a 42 y.o. female. [id number]  LMP 3 weeks ago  Increased menstrual flow and pain for the last 6 months. Paragard in place since 09/2005. No abnormal discharge or intermenstrual spotting. Menses last for 7 days, become heavy for 1 day. Wants to have tubal ligation, doesn't want another IUD or pills. The risk of failure, ectopic, bleeding, infection, pain, visceral organ damage was discussed and her questions were answered. HPI  Past Medical History   Diagnosis  Date   .  Asthma     Past Surgical History   Procedure  Date   .  Cesarean section    .  Breast surgery  2010     breast reduction   .  Liposuction trunk  2010    Family History   Problem  Relation  Age of Onset   .  Heart disease  Father    .  Diabetes  Sister    .  Hypertension  Sister    .  Hypertension  Brother    Social History  History   Substance Use Topics   .  Smoking status:  Never Smoker   .  Smokeless tobacco:  Not on file   .  Alcohol Use:  No   No Known Allergies  No current outpatient prescriptions on file.   Review of Systems  Review of Systems  Constitutional: Negative for appetite change.  Gastrointestinal: Positive for nausea (with pain). Negative for diarrhea and constipation.  Genitourinary: Positive for vaginal bleeding and menstrual problem. Negative for vaginal discharge, vaginal pain and dyspareunia.  Filed Vitals:   08/15/12 1531  Height: 5\' 1"  (1.549 m)  Weight: 150 lb (68.04 kg)    Physical Exam  Physical Exam  Constitutional: She appears well-developed and well-nourished. No distress.  Pulmonary/Chest: Effort normal. No respiratory distress.  Abdominal: Soft. She exhibits no distension and no mass.  Genitourinary: Vagina normal and uterus normal.  Paragard string visible 2-3 cm, no tenderness  Skin: Skin  is warm and dry.  Psychiatric: She has a normal mood and affect. Her behavior is normal.  Data Reviewed  ED visit  Assessment  Menorrhagia and dysmenorrhea with Paragard, desires sterilization  Plan  Sign Medicaid paper  Schedule LBTL. Procedure and risks explained and questions answered. Remove IUD in OR  [place] Giammona  08/22/2012

## 2012-08-22 NOTE — Anesthesia Postprocedure Evaluation (Signed)
  Anesthesia Post Note  Patient: [name]  Procedure(s) Performed: Procedure(s) (LRB): LAPAROSCOPIC TUBAL LIGATION (Bilateral)  Anesthesia type: GA  Patient location: PACU  Post pain: Pain level controlled  Post assessment: Post-op Vital signs reviewed  Last Vitals:  Filed Vitals:   08/22/12 1004  BP: 131/80  Pulse: 62  Temp: 36.7 C  Resp: 18    Post vital signs: Reviewed  Level of consciousness: sedated  Complications: No apparent anesthesia complications

## 2012-08-23 ENCOUNTER — Encounter (HOSPITAL_COMMUNITY): Payer: Self-pay | Admitting: Obstetrics & Gynecology

## 2012-09-14 ENCOUNTER — Encounter: Payer: Self-pay | Admitting: Obstetrics & Gynecology

## 2012-09-14 ENCOUNTER — Ambulatory Visit (INDEPENDENT_AMBULATORY_CARE_PROVIDER_SITE_OTHER): Payer: Medicaid Other | Admitting: Obstetrics & Gynecology

## 2012-09-14 VITALS — BP 139/80 | HR 58 | Temp 97.2°F | Ht 61.0 in | Wt 150.6 lb

## 2012-09-14 DIAGNOSIS — Z09 Encounter for follow-up examination after completed treatment for conditions other than malignant neoplasm: Secondary | ICD-10-CM

## 2012-09-14 NOTE — Progress Notes (Signed)
Patient ID: Jacalyn Lefevre, female   DOB: February 01, 1971, 42 y.o.   MRN: [id number] Subjective:     JOSETTA WIGAL is a 42 y.o. female who presents to the clinic 3 weeks status post laparoscopy for requested sterilization. Eating a regular diet without difficulty. Bowel movements are normal. The patient is not having any pain.  The following portions of the patient's history were reviewed and updated as appropriate: allergies, current medications, past family history, past medical history, past social history, past surgical history and problem list.  Review of Systems Pertinent items are noted in HPI.    Objective:    BP 139/80  Pulse 58  Temp(Src) 97.2 F (36.2 C) (Oral)  Ht 5\' 1"  (1.549 m)  Wt 150 lb 9.6 oz (68.312 kg)  BMI 28.47 kg/m2  LMP 08/29/2012 General:  alert and cooperative  Abdomen: soft, bowel sounds active, non-tender  Incision:   healing well, no drainage, no erythema, no hernia, no seroma, no swelling, no dehiscence, incision well approximated     Assessment:    Doing well postoperatively. Operative findings again reviewed.   Plan:    1. Continue any current medications. 2. Wound care discussed. 3. Activity restrictions: none 4. Anticipated return to work: now. 5. Follow up: 6 mo for well woman exam.  ARNOLD,JAMES 09/14/2012

## 2012-09-14 NOTE — Patient Instructions (Signed)
Laparoscopic Tubal Ligation Laparoscopic tubal ligation is a procedure that closes the fallopian tubes at a time other than right after childbirth. By closing the fallopian tubes, the eggs that are released from the ovaries cannot enter the uterus and sperm cannot reach the egg. Tubal ligation is also known as getting your "tubes tied." Tubal ligation is done so you will not be able to get pregnant or have a baby.  Although this procedure may be reversed, it should be considered permanent and irreversible. If you want to have future pregnancies, you should not have this procedure.  LET YOUR CAREGIVER KNOW ABOUT:  Allergies to food or medicine.  Medicines taken, including vitamins, herbs, eyedrops, over-the-counter medicines, and creams.  Use of steroids (by mouth or creams).  Previous problems with numbing medicines.  History of bleeding problems or blood clots.  Any recent colds or infections.  Previous surgery.  Other health problems, including diabetes and kidney problems.  Possibility of pregnancy, if this applies.  Any past pregnancies. RISKS AND COMPLICATIONS   Infection.  Bleeding.  Injury to surrounding organs.  Anesthetic side effects.  Failure of the procedure.  Ectopic pregnancy.  Future regret about having the procedure done. BEFORE THE PROCEDURE  Do not take aspirin or blood thinners a week before the procedure or as directed. This can cause bleeding.  Do not eat or drink anything 6 to 8 hours before the procedure. PROCEDURE   You may be given a medicine to help you relax (sedative) before the procedure. You will be given a medicine to make you sleep (general anesthetic) during the procedure.  A tube will be put down your throat to help your breath while under general anesthesia.  Two small cuts (incisions) are made in the lower abdominal area and near the belly button.  Your abdominal area will be inflated with a safe gas (carbon dioxide). This helps  give the surgeon room to operate, visualize, and helps the surgeon avoid other organs.  A thin, lighted tube (laparoscope) with a camera attached is inserted into your abdomen through one of the incisions near the belly button. Other small instruments are also inserted through the other abdominal incision.  The fallopian tubes are located and are either blocked with a ring, clip, or are burned (cauterized).  After the fallopian tubes are blocked, the gas is released from the abdomen.  The incisions will be closed with stitches (sutures), and a bandage may be placed over the incisions. AFTER THE PROCEDURE   You will rest in a recovery room for 1 4 hours until you are stable and doing well.  You will also have some mild abdominal discomfort for 3 7 days. You will be given pain medicine to ease any discomfort.  As long as there are no problems, you may be allowed to go home. Someone will need to drive you home and be with you for at least 24 hours once home.  You may have some mild discomfort in the throat. This is from the tube placed in your throat while you were sleeping.  You may experience discomfort in the shoulder area from some trapped air between the liver and diaphragm. This sensation is normal and will slowly go away on its own. Document Released: 09/28/2000 Document Revised: 12/22/2011 Document Reviewed: 10/03/2011 [employer] Patient Information 2013 [employer].  

## 2014-05-07 ENCOUNTER — Encounter: Payer: Self-pay | Admitting: Obstetrics & Gynecology

## 2016-05-08 ENCOUNTER — Encounter: Payer: Self-pay | Admitting: Gynecology

## 2016-05-08 ENCOUNTER — Ambulatory Visit (INDEPENDENT_AMBULATORY_CARE_PROVIDER_SITE_OTHER): Payer: BLUE CROSS/BLUE SHIELD | Admitting: Gynecology

## 2016-05-08 VITALS — BP 130/80 | Ht 61.0 in | Wt 154.0 lb

## 2016-05-08 DIAGNOSIS — G44001 Cluster headache syndrome, unspecified, intractable: Secondary | ICD-10-CM

## 2016-05-08 DIAGNOSIS — M2550 Pain in unspecified joint: Secondary | ICD-10-CM | POA: Diagnosis not present

## 2016-05-08 DIAGNOSIS — N951 Menopausal and female climacteric states: Secondary | ICD-10-CM

## 2016-05-08 DIAGNOSIS — R102 Pelvic and perineal pain: Secondary | ICD-10-CM | POA: Diagnosis not present

## 2016-05-08 DIAGNOSIS — M6289 Other specified disorders of muscle: Secondary | ICD-10-CM | POA: Diagnosis not present

## 2016-05-08 DIAGNOSIS — Z01411 Encounter for gynecological examination (general) (routine) with abnormal findings: Secondary | ICD-10-CM | POA: Diagnosis not present

## 2016-05-08 DIAGNOSIS — N914 Secondary oligomenorrhea: Secondary | ICD-10-CM

## 2016-05-08 NOTE — Progress Notes (Signed)
[name] 01-Dec-1970 [id number]   History:    45 y.o.  for annual gyn exam who has not been seen by a medical provider in several years. Patient had an abdominal plasty and breast reduction in [place] many years ago. Patient with multiple complaints today as follows:  #1 patient compliance of tiredness and muscle fatigue throughout the day #2 patient complaining some joint discomfort during the day but wears office today progresses #3 patient complaining of time skipping periods #4 patient complaining of occasional hot flashes mood swings and irritability #5 patient complains of cluster headaches to come and go at different times #6 patient complains at times of initial disturbance #7 on and off lower abdominal pelvic discomfort  Patient has 2 vaginal deliveries one cesarean section the last resulting with a tubal ligation.  Patient stated that she had been informed the past that she had ovarian cyst. Patient refused flu vaccine today  Past medical history,surgical history, family history and social history were all reviewed and documented in the EPIC chart.  Gynecologic History Patient's last menstrual period was 05/06/2016. Contraception: tubal ligation Last Pap: Several years ago. Results were: normal Last mammogram: 2015. Results were: normal  Obstetric History OB History  Gravida Para Term Preterm AB Living  4 3 3  0 1 3  SAB TAB Ectopic Multiple Live Births  0 1 0 0      # Outcome Date GA Lbr Len/2nd Weight Sex Delivery Anes PTL Lv  4 TAB           3 Term           2 Term           1 Term                ROS: A ROS was performed and pertinent positives and negatives are included in the history.  GENERAL: No fevers or chills. HEENT: No change in vision, no earache, sore throat or sinus congestion. NECK: No pain or stiffness. CARDIOVASCULAR: No chest pain or pressure. No palpitations. PULMONARY: No shortness of breath, cough or wheeze.  GASTROINTESTINAL: No abdominal pain, nausea, vomiting or diarrhea, melena or bright red blood per rectum. GENITOURINARY: No urinary frequency, urgency, hesitancy or dysuria. MUSCULOSKELETAL: No joint or muscle pain, no back pain, no recent trauma. DERMATOLOGIC: No rash, no itching, no lesions. ENDOCRINE: No polyuria, polydipsia, no heat or cold intolerance. No recent change in weight. HEMATOLOGICAL: No anemia or easy bruising or bleeding. NEUROLOGIC: No headache, seizures, numbness, tingling or weakness. PSYCHIATRIC: No depression, no loss of interest in normal activity or change in sleep pattern.     Exam: chaperone present  BP 130/80   Ht 5\' 1"  (1.549 m)   Wt 154 lb (69.9 kg)   LMP 05/06/2016   BMI 29.10 kg/m   Body mass index is 29.1 kg/m.  General appearance : Well developed well nourished female. No acute distress HEENT: Eyes: no retinal hemorrhage or exudates,  Neck supple, trachea midline, no carotid bruits, no thyroidmegaly Lungs: Clear to auscultation, no rhonchi or wheezes, or rib retractions  Heart: Regular rate and rhythm, no murmurs or gallops Breast:Examined in sitting and supine position were symmetrical in appearance, no palpable masses or tenderness,  no skin retraction, no nipple inversion, no nipple discharge, no skin discoloration, no axillary or supraclavicular lymphadenopathy Abdomen: no palpable masses or tenderness, no rebound or guarding Extremities: no edema or skin discoloration or tenderness  Pelvic:  Bartholin, Urethra, Skene Glands:  Within normal limits             Vagina: No gross lesions or discharge  Cervix: No gross lesions or discharge  Uterus  anteverted, normal size, shape and consistency, non-tender and mobile  Adnexa  Without masses or tenderness  Anus and perineum  normal   Rectovaginal  normal sphincter tone without palpated masses or tenderness             Hemoccult not indicated     Assessment/Plan:  45 y.o. female for annual exam patient  will be referred to the neurologist for further evaluation on her headaches. Patient will return back to the office next week for fasting blood work consisting of a comprehensive metabolic panel, fasting lipid profile, TSH, CBC, and urinalysis. Because of her muscle tiredness and fatigue will check a vitamin D level and because her joint discomfort we'll going to check her rheumatoid factor. She will also return the following week for an ultrasound to better assess her adnexa. Because of her vasomotor symptoms and FSH will be drawn. Patient refused the flu vaccine today. We discussed importance of monthly breast exams. We discussed importance of calcium vitamin D and weightbearing exercise for osteoporosis prevention. Literature information on the menopause and hormone replacement therapy was provided also that we will discuss to the time of her ultrasound what her labs are back.   Terrance Mass MD, 4:45 PM 05/08/2016

## 2016-05-08 NOTE — Patient Instructions (Signed)
Terapia de reemplazo hormonal (Hormone Therapy) En la menopausia, su cuerpo comienza a producir menos estrgeno y Immunologist. Esto provoca que el cuerpo deje de tener perodos [place]. Esto se debe a que el estrgeno y la progesterona controlan sus perodos y su ciclo menstrual. [name] falta de estrgeno puede causar sntomas tales como:  Social research officer, government.  Sequedad vaginal  Piel seca.  Prdida del deseo sexual.  Riesgo de prdida de hueso (osteoporosis). Cuando esto ocurre, puede elegir realizar [name] terapia hormonal para volver a Clinical research associate estrgeno perdido [employer]. Cuando slo se introduce esta hormona, el procedimiento se conoce normalmente como TRE (terapia de reemplazo de [place]). Cuando la hormona progestina se combina con el estrgeno, el procedimiento se conoce normalmente como TH (terapia hormonal). Esto es lo que previamente se conoca como terapia de reemplazo hormonal (TRH). El profesional que le asiste le ayudar a tomar una decisin acerca de lo que resulte lo mejor para usted. La decisin de realizar una TRH cambia a menudo debido a que se Risk manager. Muchos estudios no ponen de acuerdo con respecto a los beneficios de Optometrist una terapia de reemplazo hormonal.  BENEFICIOS PROBABLES DE LA TRH QUE INCLUYEN PROTECCIN CONTRA:  Golpes de calor - Un golpe de calor es la sensacin repentina de calor sobre la cara y el cuerpo. La piel enrojece, como al sonrojarse. Estn asociados con la transpiracin y los trastornos del sueo. Las mujeres que atraviesan la menopausia pueden tener golpes de calor unas pocas veces en el mes o varios al da; esto depende de la mujer.  Osteoporosis (prdida de hueso) - El estrgeno ayuda a protegerse contra la prdida de [place]. Luego de la [place], los huesos de una mujer pierden calcio y se vuelven frgiles y Publishing rights manager. Como resultado, es ms probable que el hueso se [place]. Los que resultan afectados con mayor  frecuencia son los de la cadera, la [place] y la columna vertebral. La terapia hormonal puede ayudar a retardar la prdida de hueso luego de la menopausia. Realizar ejercicios con peso y tomar calcio con vitamina D tambin puede ayudar a prevenir la prdida de [place]. Existen medicamentos que puede prescribir el profesional que la asiste para ayudar a prevenir la osteoporosis.  Sequedad vaginal - La prdida de estrgeno produce cambios en la vagina. El recubrimiento de la misma puede volverse fino y Education officer, museum. Estos cambios pueden causar dolor y [place]. La sequedad tambin puede producir una infeccin. Puede ocasionarle ardor y [place].  Las infecciones en las vas urinarias son ms comunes luego de la menopausia debido a la falta de Oceanographer.  Otros beneficios posibles del estrgeno incluyen un cambio positivo en el humor y en la memoria de corto plazo en las mujeres. EFECTOS SECUNDARIOS Y RIESGOS  Utilizar estrgeno slo sin progesterona causa que el recubrimiento del tero crezca. Esto aumenta el riesgo de cncer endometrial. El profesional que la asiste deber darle otra hormona llamada progestina, si usted tiene tero.  Las mujeres que realizan una TH combinada (estrgeno y progestina) parecen tener un mayor riesgo de sufrir cncer de mama. El riesgo parece ser [place], [place] aumenta a lo largo del tiempo que se realice la [place].  La terapia combinada tambin hace que el tejido mamario sea levemente ms denso, lo que hace que sea ms difcil leer mamografas (radiografas de mama).  Combinada, la terapia de estrgeno y Immunologist puede realizarse todos los das, en cuyo caso podrn Warehouse manager de [place]. La TH puede realizarse de  manera cclica, en cuyo caso tendr perodos menstruales.  La TH puede aumentar el riesgo de [place], ataque cardaco, cncer de mama y formacin de cogulos en la pierna.  El estrgeno transdrmico (estrgeno que se absorbe a Designer, jewellery  de la piel mediante el uso de un parche o una crema) puede tener mejores resultados en los siguientes casos:  Colesterol.  La presin arterial.  Cogulos sanguneos. La presencia de estas afecciones puede indicar que no debe realizarse terapia hormonal:  Cncer de endometrio.  Enfermedad heptica.  Cncer de mama.  Cardiopata.  Antecedentes de cogulos sanguneos.  Ictus. TRATAMIENTO  Si decide realizar [name] TH y tiene tero, normalmente se prescribe el uso de estrgeno y progestina.  El profesional que la asiste le ayudar a decidir la mejor forma de [employer].  [employer] formas posibles de administracin de [place], se incluyen las siguientes:  Pldoras.  Parches.  Geles.  Aerosoles.  Crema, anillos y vulos vaginales con estrgeno.  Lo mejor es Chief Executive Officer dosis posible que pueda ayudarla con sus sntomas y tomarlos durante la menor cantidad de tiempo posible.  La terapia hormonal puede ayudar a Banker de los problemas (sntomas) que afectan a las mujeres durante la menopausia. Antes de tomar una decisin con respecto a la TH, converse con el profesional que la asiste acerca de qu es lo mejor para usted. Mantngase bien informada y sintase cmoda con sus decisiones. INSTRUCCIONES PARA EL CUIDADO DOMICILIARIO:  [place] indicaciones del profesional con respecto a cmo Biomedical scientist.  Se realiza una prueba de Papanicolaou para detectar el cncer de cuello del tero.  El primer Papanicolaou debe realizarse a los 21 aos.  La prueba se repite cada 2 aos entre los 21 y los 29 aos.  Despus de los 30 aos, debe realizarse una prueba de Papanicolaou cada 3 aos siempre que los 3 estudios anteriores hayan sido normales.  Algunas mujeres presentan problemas mdicos que aumentan la probabilidad de Museum/gallery curator cncer de cuello del tero. Consulte a su mdico acerca de estos problemas. Es muy importante que le informe a su mdico si  aparecen nuevos problemas poco despus de su ltimo Papanicolaou. En estos casos, el mdico podr indicar que se realice el Papanicolaou con ms frecuencia.  Las [employer] son las mismas para las mujeres que hayan recibido o no la vacuna para el VPH (virus del papiloma humano).  Si le han realizado una histerectoma por un problema que no era cncer o una afeccin que pudiera causar cncer, ya no necesitar un Papanicolaou. Sin embargo, aunque ya no necesite hacerse un Papanicolaou, es una buena idea hacerse un examen regularmente para asegurarse de que no haya otros problemas.  Si tiene entre 65 y 29 aos y ha tenido [employer] estudios de Papanicolaou en los ltimos 10 aos, ya no ser IT sales professional. Sin embargo, aunque ya no necesite hacerse un Papanicolaou, es una buena idea hacerse un examen regularmente para asegurarse de que no haya otros problemas.  Si ha recibido un tratamiento para Science writer cervical o una enfermedad que podra causar cncer, necesitar realizarse una prueba de Papanicolaou y controles durante al menos 45 aos de concluido el [place].  Si no se ha [place] [employer] con regularidad, Chief Executive Officer a [employer] factores de riesgo (como el tener un nuevo compaero sexual) para Teacher, adult education si es necesario que se los haga de [place].  Es posible que algunas mujeres deban realizarse exmenes de deteccin con mayor frecuencia  si presentan un alto riesgo de padecer cncer de cuello del tero.  Hgase controles de [place] regular, e incluya Papanicolau y [place]. SOLICITE ATENCIN MDICA DE INMEDIATO SI PRESENTA:  Hemorragia vaginal anormal.  Dolor o inflamacin en las piernas, falta de aliento o Tourist information centre manager.  Mareos o dolores de [place].  Protuberancias o cambios en sus mamas o axilas.  Pronunciacin inarticulada.  Debilidad o adormecimiento en los brazos o las piernas.  Dolor, ardor o sangrado al [employer].  Dolor  abdominal.   Esta informacin no tiene como fin reemplazar el consejo del mdico. Asegrese de hacerle al mdico cualquier pregunta que tenga.   Document Released: 12/09/2007 Document Revised: 11/06/2014 Elsevier Interactive Patient Education 2016 [place] menopausia (Menopause) La menopausia es el perodo normal de la vida en el cual los perodos menstruales cesan por completo. Se completa cuando no se tienen 12 perodos menstruales consecutivos. Ocurre generalmente en mujeres entre los 40 y los 61 aos. Muy rara vez una mujer tiene la menopausia antes de los 40 [place]. En la menopausia, los ovarios dejan de producir las hormonas femeninas estrgeno y Immunologist. Esto puede causar sntomas indeseables y Technical brewer a Technical sales engineer. A veces los sntomas aparecen entre 4 y 5 aos antes de que comience la menopausia. No existe una relacin [employer] y:  [place] anticonceptivos orales.  La cantidad de hijos que tuvo.  La raza.  La edad en que comenzaron los perodos menstruales (menarca). Las mujeres que fuman mucho y las que son muy delgadas pueden desarrollar la menopausia a una edad ms temprana. CAUSAS  Los ovarios dejan de producir las hormonas femeninas estrgeno y Immunologist.  Otras causas son:  [place] en la que se extirpan ambos ovarios.  Los ovarios que dejan de funcionar sin un motivo conocido.  Tumores de la glndula pituitaria en el cerebro.  Enfermedades que [employer] ovarios y la produccin de hormonas.  Radioterapia en el abdomen o la pelvis.  Quimioterapia que afecte a los ovarios. SNTOMAS   Acaloramiento.  Sudoracin nocturna.  Disminucin del deseo sexual.  Sequedad vaginal y adelgazamiento de la vagina, lo que causa relaciones sexuales dolorosas.  Sequedad de la piel y aparicin de Glass blower/designer.  Dolores de [place].  Cansancio.  Irritabilidad.  Problemas de memoria.  Aumento de [place].  Infeccin de la vejiga.  Aparicin de vello  en el rostro y [place].  Infertilidad. Los sntomas ms graves pueden incluir:  Prdida de masa sea osteoporosis) que favorece la rotura de huesos(fracturas).  Depresin.  Endurecimiento y Public librarian de las arterias (aterosclerosis) lo que puede causar infarto e ictus. DIAGNSTICO   El perodo menstrual no aparece por 12 meses seguidos.  Examen fsico.  Estudios hormonales de Herbalist. TRATAMIENTO  Hay muchas opciones de tratamiento y casi tantas dudas como opciones existen. La decisin de tratar o no los cambios que trae la menopausia es una decisin que realiza el profesional de acuerdo con cada Advertising copywriter. El Management consultant las opciones de tratamiento con usted. Juntos podrn decidir qu tratamiento es el mejor para usted. Las opciones de tratamiento pueden incluir:   Terapia hormonal (estrgenos y Immunologist).  Medicamentos no hormonales.  Tratamiento de los sntomas individuales con medicamentos (por ejemplo antidepresivos para la depresin).  Algunos medicamentos herbales pueden ayudar en sntomas especficos.  Psicoterapia con un psiclogo o psiquiatra.  Terapia grupal.  Cambios en el estilo de vida, como:  Consumir una dieta saludable.  Actividad fsica regular.  Limitar el consumo  de cafena y alcohol.  Control del estrs y Public affairs consultant.  No realizar tratamiento. [place] todos los medicamentos como el mdico le indique.  Descanse y duerma lo suficiente.  Haga ejercicios regularmente.  Consuma una dieta que contenga calcio (bueno para los [place]) y productos derivados de la soja (actan como estrgenos).  Evite las bebidas alcohlicas.  No fume.  Si tiene sofocones, vstase en capas.  Tome suplementos de calcio y vitamina D para fortalecer los [place].  Puede utilizar lubricantes y humectantes de venta libre para la sequedad vaginal.  En algunos casos la terapia de grupo podr  ayudarla.  La acupuntura puede ser de ayuda en ciertos casos. SOLICITE ATENCIN MDICA SI:   No est segura de estar en la menopausia.  Tiene sntomas de [place] y [place] asesoramiento y Clinical research associate.  Tiene 11 aos y an tiene Chartered certified accountant.  Tiene dolor durante las [employer].  La menopausia se ha completado (no ha tenido el perodo menstrual durante 12 meses) y tiene un sangrado vaginal.  Necesita ser derivada a un especialista (gineclogo, psiquiatra, o psiclogo) para Arts administrator. SOLICITE ATENCIN MDICA DE INMEDIATO SI:   Siente una depresin intensa e incontrolable.  Tiene una hemorragia vaginal abundante.  Siente y cree que se ha fracturado un hueso.  Siente dolor al [employer].  Siente dolor en el pecho o en la pierna.  Tiene latidos cardacos rpidos (palpitaciones).  Siente dolor de cabeza intenso.  Tiene problemas de visin.  Siente un bulto en la mama.  Siente un dolor abdominal intenso o indigestin.   Esta informacin no tiene Marine scientist el consejo del mdico. Asegrese de hacerle al mdico cualquier pregunta que tenga.   Document Released: 08/03/2006 Document Revised: 02/22/2013 Elsevier Interactive Patient Education [employer].

## 2016-05-09 LAB — URINALYSIS W MICROSCOPIC + REFLEX CULTURE
Bacteria, UA: NONE SEEN [HPF]
Bilirubin Urine: NEGATIVE
Casts: NONE SEEN [LPF]
Crystals: NONE SEEN [HPF]
Glucose, UA: NEGATIVE
Ketones, ur: NEGATIVE
Leukocytes, UA: NEGATIVE
Nitrite: NEGATIVE
Protein, ur: NEGATIVE
Specific Gravity, Urine: 1.022 (ref 1.001–1.035)
Squamous Epithelial / LPF: NONE SEEN [HPF] (ref <=5)
WBC, UA: NONE SEEN WBC/HPF (ref <=5)
Yeast: NONE SEEN [HPF]
pH: 6 (ref 5.0–8.0)

## 2016-05-10 LAB — URINE CULTURE: Organism ID, Bacteria: NO GROWTH

## 2016-05-11 ENCOUNTER — Other Ambulatory Visit: Payer: BLUE CROSS/BLUE SHIELD

## 2016-05-11 ENCOUNTER — Other Ambulatory Visit: Payer: Self-pay | Admitting: Gynecology

## 2016-05-11 DIAGNOSIS — N951 Menopausal and female climacteric states: Secondary | ICD-10-CM

## 2016-05-11 DIAGNOSIS — Z01411 Encounter for gynecological examination (general) (routine) with abnormal findings: Secondary | ICD-10-CM

## 2016-05-11 DIAGNOSIS — R3129 Other microscopic hematuria: Secondary | ICD-10-CM

## 2016-05-11 DIAGNOSIS — N914 Secondary oligomenorrhea: Secondary | ICD-10-CM

## 2016-05-11 LAB — CBC WITH DIFFERENTIAL/PLATELET
Basophils Absolute: 0 cells/uL (ref 0–200)
Basophils Relative: 0 %
Eosinophils Absolute: 120 cells/uL (ref 15–500)
Eosinophils Relative: 2 %
HCT: 35.9 % (ref 35.0–45.0)
Hemoglobin: 11.8 g/dL (ref 11.7–15.5)
Lymphocytes Relative: 31 %
Lymphs Abs: 1860 cells/uL (ref 850–3900)
MCH: 28.6 pg (ref 27.0–33.0)
MCHC: 32.9 g/dL (ref 32.0–36.0)
MCV: 87.1 fL (ref 80.0–100.0)
MPV: 10.6 fL (ref 7.5–12.5)
Monocytes Absolute: 360 cells/uL (ref 200–950)
Monocytes Relative: 6 %
Neutro Abs: 3660 cells/uL (ref 1500–7800)
Neutrophils Relative %: 61 %
Platelets: 213 10*3/uL (ref 140–400)
RBC: 4.12 MIL/uL (ref 3.80–5.10)
RDW: 13.8 % (ref 11.0–15.0)
WBC: 6 10*3/uL (ref 3.8–10.8)

## 2016-05-11 LAB — COMPREHENSIVE METABOLIC PANEL
ALT: 15 U/L (ref 6–29)
AST: 17 U/L (ref 10–35)
Albumin: 3.8 g/dL (ref 3.6–5.1)
Alkaline Phosphatase: 63 U/L (ref 33–115)
BUN: 15 mg/dL (ref 7–25)
CO2: 26 mmol/L (ref 20–31)
Calcium: 8.6 mg/dL (ref 8.6–10.2)
Chloride: 103 mmol/L (ref 98–110)
Creat: 0.71 mg/dL (ref 0.50–1.10)
Glucose, Bld: 87 mg/dL (ref 65–99)
Potassium: 4 mmol/L (ref 3.5–5.3)
Sodium: 138 mmol/L (ref 135–146)
Total Bilirubin: 0.4 mg/dL (ref 0.2–1.2)
Total Protein: 6.9 g/dL (ref 6.1–8.1)

## 2016-05-11 LAB — TSH: TSH: 1.63 mIU/L

## 2016-05-12 ENCOUNTER — Encounter: Payer: Self-pay | Admitting: Gynecology

## 2016-05-12 ENCOUNTER — Other Ambulatory Visit: Payer: Self-pay | Admitting: Gynecology

## 2016-05-12 DIAGNOSIS — Z1231 Encounter for screening mammogram for malignant neoplasm of breast: Secondary | ICD-10-CM

## 2016-05-12 LAB — FOLLICLE STIMULATING HORMONE: FSH: 7.8 m[IU]/mL

## 2016-05-12 LAB — RHEUMATOID FACTOR: Rhuematoid fact SerPl-aCnc: <14 IU/mL (ref <14)

## 2016-05-12 LAB — VITAMIN D 25 HYDROXY (VIT D DEFICIENCY, FRACTURES): Vit D, 25-Hydroxy: 22 ng/mL — ABNORMAL LOW (ref 30–100)

## 2016-05-13 LAB — PAP, TP IMAGING W/ HPV RNA, RFLX HPV TYPE 16,18/45: HPV mRNA, High Risk: NOT DETECTED

## 2016-05-20 ENCOUNTER — Other Ambulatory Visit: Payer: Self-pay | Admitting: Gynecology

## 2016-05-20 DIAGNOSIS — E559 Vitamin D deficiency, unspecified: Secondary | ICD-10-CM

## 2016-05-20 MED ORDER — VITAMIN D (ERGOCALCIFEROL) 1.25 MG (50000 UNIT) PO CAPS: 50000.0000 [IU] | ORAL_CAPSULE | ORAL | 0 refills | Status: DC

## 2016-05-21 ENCOUNTER — Other Ambulatory Visit: Payer: Self-pay | Admitting: Gynecology

## 2016-05-21 ENCOUNTER — Ambulatory Visit (INDEPENDENT_AMBULATORY_CARE_PROVIDER_SITE_OTHER): Payer: BLUE CROSS/BLUE SHIELD

## 2016-05-21 ENCOUNTER — Ambulatory Visit (INDEPENDENT_AMBULATORY_CARE_PROVIDER_SITE_OTHER): Payer: BLUE CROSS/BLUE SHIELD | Admitting: Gynecology

## 2016-05-21 ENCOUNTER — Encounter: Payer: Self-pay | Admitting: Gynecology

## 2016-05-21 VITALS — BP 134/86

## 2016-05-21 DIAGNOSIS — N838 Other noninflammatory disorders of ovary, fallopian tube and broad ligament: Secondary | ICD-10-CM

## 2016-05-21 DIAGNOSIS — R3129 Other microscopic hematuria: Secondary | ICD-10-CM

## 2016-05-21 DIAGNOSIS — R102 Pelvic and perineal pain: Secondary | ICD-10-CM | POA: Diagnosis not present

## 2016-05-21 DIAGNOSIS — N839 Noninflammatory disorder of ovary, fallopian tube and broad ligament, unspecified: Secondary | ICD-10-CM | POA: Diagnosis not present

## 2016-05-21 DIAGNOSIS — R1900 Intra-abdominal and pelvic swelling, mass and lump, unspecified site: Secondary | ICD-10-CM

## 2016-05-21 LAB — URINALYSIS W MICROSCOPIC + REFLEX CULTURE
Bilirubin Urine: NEGATIVE
Casts: NONE SEEN [LPF]
Crystals: NONE SEEN [HPF]
Glucose, UA: NEGATIVE
Ketones, ur: NEGATIVE
Nitrite: NEGATIVE
Protein, ur: NEGATIVE
Specific Gravity, Urine: 1.02 (ref 1.001–1.035)
Yeast: NONE SEEN [HPF]
pH: 6 (ref 5.0–8.0)

## 2016-05-21 NOTE — Progress Notes (Addendum)
   Patient is a 44 year old was seen for the first time as a new patient on November 13,017. She had not had a gynecological examination in several years. Patient recently diagnosed with vitamin D deficiency. Also at time of her annual exam there is some blood in her urine and it was question of whether was at time of her annual exam coincided with her menstrual cycle. She is here also for a urinalysis as well as for an ultrasound because of her history of ovarian cysts and difficult pelvic exam recently. Patient with previous tubal ligation and abdominoplasty.  Ultrasound today: Uterus measured 9.7 x 6.2 x 4.8 cm  with endometrial stripe of 11.6 mm (currently on day 15 of cycle) left ovary enlarged rim of tissue with arterial blood flow seen going to the tissue. A thin wall septated cystic mass measuring 7.9 x 7.2 x 8.5 cm negative color flow located within the ovary also was a solid cystic mass with positive color flow in the periphery measuring 3.9 x 2.9 x 2.7 cm. Negative fluid in the cul-de-sac.  Assessment/plan: 45 year old with large left ovarian cyst with solid component and septation we'll check a CA 125 today. We'll tentatively make arrangements for laparoscopy with left salpingo-oophorectomy and right salpingectomy in January. We'll repeat her ultrasound the week before her scheduled surgery this part of her preop a lesser CA-125 is elevated minimal referred to GYN oncologist. Literature information on ovarian cyst, CA 125, and laparoscopy provided. Literature provided in [place]. Urinalysis today 10-10 WBC, 10-20 RBC few bacteria culture pending

## 2016-05-21 NOTE — Patient Instructions (Addendum)
Laparoscopia de diagnstico (Diagnostic Laparoscopy) La laparoscopia de diagnstico es un procedimiento que se hace para diagnosticar enfermedades en el abdomen. Durante su realizacin, se introduce en el abdomen un instrumento delgado del tamao de un lpiz que tiene [place] luz, llamado laparoscopio, a travs de una incisin. El laparoscopio le permite al mdico observar los rganos internos. INFORME A SU MDICO:  Cualquier alergia que tenga.  Todos los [employer], incluidos vitaminas, hierbas, gotas oftlmicas, cremas y medicamentos de venta libre.  Problemas previos que usted o los [employer] de su familia hayan tenido con el uso de anestsicos.  Enfermedades de la sangre que tenga.  Si tiene cirugas previas.  Enfermedades que tenga. RIESGOS Y COMPLICACIONES En general, se trata de un procedimiento seguro. Sin embargo, es posible que haya problemas, que pueden incluir lo siguiente:  Infeccin.  Hemorragia.  Lesiones en los rganos circundantes.  Reaccin alrgica a la anestesia usada durante el procedimiento. ANTES DEL PROCEDIMIENTO  No coma ni beba nada despus de la medianoche anterior al procedimiento o segn lo que le haya indicado el mdico.  Consulte a su mdico acerca de estos temas:  Cambiar o suspender los medicamentos que toma habitualmente.  Tomar medicamentos, como aspirina e ibuprofeno. Estos medicamentos pueden tener un efecto anticoagulante en la [place]. No tome estos medicamentos antes del procedimiento si el mdico le indica que no lo haga.  Haga planes para que una persona lo lleve de vuelta a su casa despus del procedimiento. PROCEDIMIENTO  Le administrarn un medicamento para ayudarlo a relajarse (sedante).  Le administrarn un medicamento que lo har dormir (anestesia general).  Se inflar el abdomen con un gas, lo que facilitar la observacin.  Le practicarn incisiones pequeas en el abdomen.  A travs de las incisiones, se  introducirn un laparoscopio y otros instrumentos pequeos en el abdomen.  Se puede tomar Truddie Coco de tejido de un rgano del abdomen para su anlisis.  Se retirarn los instrumentos del abdomen.  Se extraer el gas.  Las incisiones se cerrarn con puntos (suturas). DESPUS DEL PROCEDIMIENTO Le controlarn con frecuencia la presin arterial, la frecuencia cardaca, la frecuencia respiratoria y Retail buyer de oxgeno en la sangre hasta que haya desaparecido el efecto de los medicamentos administrados. Esta informacin no tiene Marine scientist el consejo del mdico. Asegrese de hacerle al mdico cualquier pregunta que tenga. Document Released: 06/22/2005 Document Revised: 07/13/2014 Document Reviewed: 02/02/2014 Elsevier Interactive Patient Education  2017 [employer] tumoral CA-125 (CA-125 Tumor Marker Test) POR QU ME DEBO REALIZAR ESTA PRUEBA? Esta prueba tiene Radiographer, therapeutic nivel del antgeno del cncer125 (CA-125) en la sangre. La prueba de marcador tumoral CA-125 puede ser til para Public affairs consultant de ovario y se realiza solamente si se considera que la mujer corre un alto riesgo de presentar este tipo de Hotel manager. El mdico puede recomendarle esta prueba si:  Usted tiene importantes antecedentes familiares de cncer de ovario.  Usted tiene un defecto gentico en el antgeno del cncer de mama (BRCA, por sus siglas en ingls). Si ya le han diagnosticado cncer de ovario, el mdico puede hacerle esta prueba como ayuda para identificar el grado de la enfermedad y Chief Technology Officer cmo responde al tratamiento. QU TIPO DE MUESTRA SE TOMA? Para esta prueba, se extrae Truddie Coco de [place]. Por lo general, para extraerla, se introduce una aguja en una vena. [place]? No se requiere una preparacin para esta prueba. [place]? Los  valores de referencia son los valores saludables establecidos despus de realizarle  el anlisis a un grupo grande de personas sanas. Pueden variar Charter Communications, laboratorios y hospitales. Es su responsabilidad retirar el resultado del [place]. Consulte en el laboratorio o en el departamento en el que fue realizado el estudio cundo y cmo podr [employer]. El valor de referencia para esta prueba es de 0a 35unidades/ml o menos de 35unidades/l (unidades SI). [place]? Los niveles ms altos de CA-125 pueden indicar lo siguiente:  Ciertos tipos de cncer, entre ellos:  Cncer de ovario.  Cncer de pncreas.  Cncer de colon.  Cncer de pulmn.  Cncer de mama.  Linfomas.  Trastornos no cancerosos (benignos), entre ellos:  Cirrosis.  Embarazo.  Endometriosis.  Pancreatitis.  Enfermedad plvica inflamatoria (EPI). Hable con el mdico [employer], las opciones de tratamiento y, si es necesario, la necesidad de Optometrist ms [place]. Hable con el mdico si tiene [employer]. Esta informacin no tiene Marine scientist el consejo del mdico. Asegrese de hacerle al mdico cualquier pregunta que tenga. Document Released: 04/12/2013 Document Revised: 07/13/2014 Document Reviewed: 11/09/2013 Elsevier Interactive Patient Education  2017 [place] ovrico (Ovarian Cyst) Un quiste ovrico es un saco lleno de lquido o [place]. Este saco est unido al ovario. Algunos quistes desaparecen solos. Otros quistes necesitan tratamiento.  CUIDADOS EN EL HOGAR   Solo tome los medicamentos que le haya indicado su mdico.  Concurra a las consultas de control con el mdico, segn las indicaciones.  Hgase exmenes plvicos regulares y pruebas de Papanicolaou. SOLICITE AYUDA SI:  Sus perodos se atrasan o son irregulares o dolorosos.  Sus perodos cesan.  Siente dolor en el vientre (abdominal) o en la pelvis que no desaparece.  El abdomen se agranda o se inflama hincha.  Tiene  dificultades para orinar (vaciar por completo la vejiga).  Siente presin en la vejiga.  Siente dolor durante las [employer].  Siente distensin, presin o Manufacturing systems engineer.  Pierde peso sin ningn motivo.  Se siente mal constantemente.  Tiene dificultades para defecar (estreimiento).  No tiene ganas de comer.  Le aparecen granos (acn).  Nota un aumento del vello corporal y facial.  [place] de peso sin motivo.  Sospecha que est embarazada. SOLICITE AYUDA DE INMEDIATO SI:   El dolor en el vientre empeora.  Tiene Higher education careers adviser (nuseas) y vomita.  Le sube la fiebre rpidamente.  Le duele el estmago mientras defeca.  Los perodos menstruales son ms abundantes de lo habitual. ASEGRESE DE QUE:   Comprende estas instrucciones.  Controlar su afeccin.  Recibir ayuda de inmediato si no mejora o si empeora. Esta informacin no tiene Marine scientist el consejo del mdico. Asegrese de hacerle al mdico cualquier pregunta que tenga. Document Released: 04/12/2013 Elsevier Interactive Patient Education  2017 [employer].

## 2016-05-22 LAB — CA 125: CA 125: 16 U/mL (ref <35)

## 2016-05-23 LAB — URINE CULTURE: Organism ID, Bacteria: NO GROWTH

## 2016-05-25 ENCOUNTER — Other Ambulatory Visit: Payer: Self-pay | Admitting: Gynecology

## 2016-05-25 DIAGNOSIS — R3129 Other microscopic hematuria: Secondary | ICD-10-CM

## 2016-06-05 ENCOUNTER — Other Ambulatory Visit: Payer: Self-pay | Admitting: Gynecology

## 2016-06-05 DIAGNOSIS — N9489 Other specified conditions associated with female genital organs and menstrual cycle: Secondary | ICD-10-CM

## 2016-06-12 ENCOUNTER — Other Ambulatory Visit: Payer: BLUE CROSS/BLUE SHIELD

## 2016-06-12 ENCOUNTER — Inpatient Hospital Stay: Admission: RE | Admit: 2016-06-12 | Payer: Self-pay | Source: Ambulatory Visit

## 2016-07-08 ENCOUNTER — Telehealth: Payer: Self-pay

## 2016-07-08 NOTE — Telephone Encounter (Signed)
I called patient because when I rechecked her [place] today it said that it terminated on 07/05/16. She said that she still works same place and insisted it remained the same. She will check with her work office and double check on this.  I explained to her that I checked twice and it said terminated 07/05/16. She will call me back.

## 2016-07-08 NOTE — Telephone Encounter (Signed)
Patient called back after talking with her employer. Evidently her company had a re-enrollment period for insurance. She did not realized it applied to her and she did not re-enroll and her ins was terminated.  The office at work told her it is too late to sign up for ins now.  She is going to see what she can do to get ins elsewhere. She told me if she has not called me by next Weds Jan 17 to please cancel her surgery.

## 2016-07-15 NOTE — Telephone Encounter (Signed)
Case has been cancelled per conversation on 07/08/16.  Schedules will be opened.

## 2016-07-16 ENCOUNTER — Ambulatory Visit: Payer: Self-pay

## 2016-07-17 ENCOUNTER — Inpatient Hospital Stay (HOSPITAL_COMMUNITY): Admission: RE | Admit: 2016-07-17 | Payer: Self-pay | Source: Ambulatory Visit

## 2016-07-22 ENCOUNTER — Ambulatory Visit: Payer: BLUE CROSS/BLUE SHIELD | Admitting: Gynecology

## 2016-07-22 ENCOUNTER — Other Ambulatory Visit: Payer: BLUE CROSS/BLUE SHIELD

## 2016-07-28 ENCOUNTER — Ambulatory Visit (HOSPITAL_COMMUNITY): Admission: RE | Admit: 2016-07-28 | Payer: Self-pay | Source: Ambulatory Visit | Admitting: Gynecology

## 2016-07-28 ENCOUNTER — Encounter (HOSPITAL_COMMUNITY): Admission: RE | Payer: Self-pay | Source: Ambulatory Visit

## 2016-07-28 SURGERY — SALPINGO-OOPHORECTOMY, UNILATERAL, LAPAROSCOPIC
Anesthesia: General | Laterality: Right

## 2016-11-18 ENCOUNTER — Encounter: Payer: Self-pay | Admitting: Gynecology

## 2017-07-06 HISTORY — PX: UPPER GASTROINTESTINAL ENDOSCOPY: SHX188

## 2017-08-31 ENCOUNTER — Encounter: Payer: Self-pay | Admitting: Obstetrics & Gynecology

## 2017-08-31 ENCOUNTER — Ambulatory Visit (INDEPENDENT_AMBULATORY_CARE_PROVIDER_SITE_OTHER): Payer: BLUE CROSS/BLUE SHIELD | Admitting: Obstetrics & Gynecology

## 2017-08-31 VITALS — BP 142/90 | Ht 61.5 in | Wt 158.0 lb

## 2017-08-31 DIAGNOSIS — Z01411 Encounter for gynecological examination (general) (routine) with abnormal findings: Secondary | ICD-10-CM

## 2017-08-31 DIAGNOSIS — N393 Stress incontinence (female) (male): Secondary | ICD-10-CM

## 2017-08-31 DIAGNOSIS — Z9851 Tubal ligation status: Secondary | ICD-10-CM

## 2017-08-31 DIAGNOSIS — N83202 Unspecified ovarian cyst, left side: Secondary | ICD-10-CM | POA: Diagnosis not present

## 2017-08-31 NOTE — Progress Notes (Signed)
[name] 10/22/1970 [id number]   History:    47 y.o. [id number] Married. S/P TL.  From [facility].  3 daughters 06-23-22 yo.    RP:  Established patient presenting for annual gyn exam   HPI: Menses every 1-2 months, normal light flow.  No pelvic pain.  Lumbar pain intermittently x many years.  Tenderness with IC, more towards the left.  Was supposed to have a LPS Lt Ovarian Cystectomy with Dr Toney Rakes at the end of 2017, but canceled because she lost her insurance.  Normal vaginal secretions.  C/O SUI, especially with a full bladder.  BMs wnl.  Breasts wnl.  BMI 29.37.  Pelvic US 05/21/2016:  Uterus measured 9.7 x 6.2 x 4.8 cm with endometrial stripe of 11.6 mm (currently on day 15 of cycle) left ovary enlarged rim of tissue with arterial blood flow seen going to the tissue. A thin wall septated cystic mass measuring 7.9 x 7.2 x 8.5 cm negative color flow located within the ovary also was a solid cystic mass with positive color flow in the periphery measuring 3.9 x 2.9 x 2.7 cm. Negative fluid in the cul-de-sac.  Past medical history,surgical history, family history and social history were all reviewed and documented in the EPIC chart.  Gynecologic History Patient's last menstrual period was 08/21/2017. Contraception: tubal ligation Last Pap: 05/2016. Results were: Negative Last mammogram: 3 yrs ago. Results were: Normal per patient Bone Density: Never Colonoscopy: Never  Obstetric History OB History  Gravida Para Term Preterm AB Living  4 3 3  0 1 3  SAB TAB Ectopic Multiple Live Births  0 1 0 0      # Outcome Date GA Lbr Len/2nd Weight Sex Delivery Anes PTL Lv  4 TAB           3 Term           2 Term           1 Term                ROS: A ROS was performed and pertinent positives and negatives are included in the history.  GENERAL: No fevers or chills. HEENT: No change in vision, no earache, sore throat or sinus congestion. NECK: No pain or stiffness.  CARDIOVASCULAR: No chest pain or pressure. No palpitations. PULMONARY: No shortness of breath, cough or wheeze. GASTROINTESTINAL: No abdominal pain, nausea, vomiting or diarrhea, melena or bright red blood per rectum. GENITOURINARY: No urinary frequency, urgency, hesitancy or dysuria. MUSCULOSKELETAL: No joint or muscle pain, no back pain, no recent trauma. DERMATOLOGIC: No rash, no itching, no lesions. ENDOCRINE: No polyuria, polydipsia, no heat or cold intolerance. No recent change in weight. HEMATOLOGICAL: No anemia or easy bruising or bleeding. NEUROLOGIC: No headache, seizures, numbness, tingling or weakness. PSYCHIATRIC: No depression, no loss of interest in normal activity or change in sleep pattern.     Exam:   BP (!) 142/90   Ht 5' 1.5" (1.562 m)   Wt 158 lb (71.7 kg)   LMP 08/21/2017   BMI 29.37 kg/m   Body mass index is 29.37 kg/m.  General appearance : Well developed well nourished female. No acute distress HEENT: Eyes: no retinal hemorrhage or exudates,  Neck supple, trachea midline, no carotid bruits, no thyroidmegaly Lungs: Clear to auscultation, no rhonchi or wheezes, or rib retractions  Heart: Regular rate and rhythm, no murmurs or gallops Breast:Examined in sitting and supine position were symmetrical in appearance, no palpable masses  or tenderness,  no skin retraction, no nipple inversion, no nipple discharge, no skin discoloration, no axillary or supraclavicular lymphadenopathy Abdomen: no palpable masses or tenderness, no rebound or guarding Extremities: no edema or skin discoloration or tenderness  Pelvic: Vulva: Normal             Vagina: No gross lesions or discharge  Cervix: No gross lesions or discharge.  Pap reflex done.  Uterus  AV, normal size, shape and consistency, non-tender and mobile  Adnexa Rt normal/NT.  Lt adnexal fullness, but no large cyst felt.  Slightly tender.  Anus: Normal   Assessment/Plan:  47 y.o. female for annual exam   1. Encounter  for gynecological examination with abnormal finding Gynecologic exam within normal limits, with possible left ovarian cyst.  Pap reflex done.  Breast exam normal.  Will schedule screening mammogram.  Health labs with family physician.  2. Tubal ligation status  3. Left ovarian cyst History of left ovarian cyst for which patient was supposed to have a laparoscopic left ovarian cystectomy, but the surgery was canceled due to loss of her insurance.  Very mildly symptomatic, if at all.  On pelvic exam today, the left adnexa is fuller, but no large cyst felt.  Will repeat pelvic US and make a decision on management based on results. - US Transvaginal Non-OB; Future  4. Urinary, incontinence, stress female Overflow stress urinary incontinence.  Recommend frequent micturitions to avoid overfilling the bladder.  Kegel exercises instructed.  Information also added to the summary.  If not sufficient after 6 months will refer to physical therapy.  Surgical correction also briefly mentioned.  Counseling on above issues more than 50% for 10 minutes.  Princess Bruins MD, 12:13 PM 08/31/2017

## 2017-08-31 NOTE — Patient Instructions (Signed)
1. Encounter for gynecological examination with abnormal finding Gynecologic exam within normal limits, with possible left ovarian cyst.  Pap reflex done.  Breast exam normal.  Will schedule screening mammogram.  Health labs with family physician.  2. Tubal ligation status  3. Left ovarian cyst History of left ovarian cyst for which patient was supposed to have a laparoscopic left ovarian cystectomy, but the surgery was canceled due to loss of her insurance.  Very mildly symptomatic, if at all.  On pelvic exam today, the left adnexa is fuller, but no large cyst felt.  Will repeat pelvic US and make a decision on management based on results. - US Transvaginal Non-OB; Future  4. Urinary, incontinence, stress female Overflow stress urinary incontinence.  Recommend frequent micturitions to avoid overfilling the bladder.  Kegel exercises instructed.  Information also added to the summary.  If not sufficient after 6 months will refer to physical therapy.  Surgical correction also briefly mentioned.  Jenetta Downer, fue un placer conocerle hoy!  Voy a informarle de sus [employer].   Ejercicios de Kegel Barrister's clerk) Los ejercicios de Kegel ayudan a fortalecer los msculos que sostienen el recto, la vagina, el intestino delgado, la vejiga y [place]. Los ejercicios de Kegel pueden ayudar a lo siguiente:  Mejorar el control de la vejiga y de los intestinos.  Mejorar la respuesta sexual.  Reducir los problemas o las molestias durante el [place]. Los ejercicios de Kegel implican apretar los msculos del suelo plvico, que son los mismos msculos que comprime cuando trata de Scientist, water quality el flujo de la [place]. Los ejercicios se pueden Regulatory affairs officer est sentado, parado o [place], pero lo mejor es variar la posicin. EJERCICIOS DE KEGEL 1. Apriete bien los msculos del suelo plvico. Debera sentir una elevacin firme en el rea del recto. Si es [place], tambin debera sentir una compresin en  el rea de la vagina. [place], las nalgas y las piernas relajadas. 2. Mantenga los msculos apretados durante 10segundos como mximo. 3. Relaje los msculos. Repita este ejercicio 50veces al da o como se lo haya indicado el mdico. Contine haciendo este ejercicio durante al menos 4 a 6semanas y Doctor, general practice tiempo que le haya indicado el mdico. Esta informacin no tiene Marine scientist el consejo del mdico. Asegrese de hacerle al mdico cualquier pregunta que tenga. Document Released: 06/08/2012 Document Revised: 05/12/2015 Document Reviewed: 05/12/2015 Elsevier Interactive Patient Education  2018 [place] (Health Maintenance, Female) Un estilo de vida saludable y los cuidados preventivos pueden favorecer considerablemente a la salud y Musician. Pregunte a su mdico cul es el cronograma de exmenes peridicos apropiado para usted. Esta es una buena oportunidad para consultarlo sobre cmo prevenir enfermedades y [place] sano. Adems de los controles, hay muchas otras cosas que puede hacer usted mismo. Los expertos han realizado numerosas investigaciones [employer] cambios en el estilo de vida y las medidas de prevencin que, [place], lo ayudarn a mantenerse sano. Solicite a su mdico ms informacin. EL PESO Y LA DIETA Consuma una dieta saludable.  Asegrese de [employer] verduras, frutas, productos lcteos de bajo contenido de [place] y Advertising account planner.  No consuma muchos alimentos de alto contenido de grasas slidas, azcares agregados o sal.  Realice actividad fsica con regularidad. Esta es una de las prcticas ms importantes que puede hacer por su salud. ? La Delorise Shiner de los adultos deben hacer ejercicio durante al menos 170mnutos por semana. El ejercicio debe aumentar  la frecuencia cardaca y Forensic psychologist transpiracin (ejercicio de intensidad [place]). ? La mayora de los adultos tambin deben hacer  ejercicios de elongacin al [employer] veces a la semana. Agregue esto al su plan de ejercicio de intensidad moderada. Mantenga un peso saludable.  El ndice de masa corporal [facility]) es una medida que puede utilizarse para identificar posibles problemas de [place]. Proporciona una estimacin de la grasa corporal basndose en el peso y la altura. Su mdico puede ayudarle a Radiation protection practitioner [place] y a Scientist, forensic o Theatre manager un peso saludable.  Para las mujeres de 20aos o ms: ? Un [facility] menor de 18,5 se considera bajo peso. ? Un [facility] entre 18,5 y 24,9 es normal. ? Un [facility] entre 25 y 29,9 se considera sobrepeso. ? Un IMC de 30 o ms se considera obesidad. Observe los niveles de colesterol y lpidos en la sangre.  Debe comenzar a English as a second language teacher de lpidos y Research officer, trade union en la sangre a los 20aos y luego repetirlos cada 55aos.  Es posible que Automotive engineer los niveles de colesterol con mayor frecuencia si: ? Sus niveles de lpidos y colesterol son altos. ? Es mayor de 00BBC. ? Presenta un alto riesgo de padecer enfermedades cardacas. DETECCIN DE CNCER Cncer de pulmn  Se recomienda realizar exmenes de deteccin de cncer de pulmn a personas adultas entre 25 y 107 aos que estn en riesgo de Horticulturist, commercial de pulmn por sus antecedentes de consumo de tabaco.  Se recomienda una tomografa computarizada de baja dosis de los pulmones todos los aos a las personas que: ? Fuman actualmente. ? Hayan dejado el hbito en algn momento en los ltimos 15aos. ? Hayan fumado durante 30aos un paquete diario. Un paquete-ao equivale a fumar un promedio de un paquete de cigarrillos diario durante un ao.  Los exmenes de deteccin anuales deben continuar hasta que hayan pasado 15aos desde que dej de fumar.  Ya no debern realizarse si tiene un problema de salud que le impida recibir tratamiento para Science writer de pulmn. Cncer de mama  Practique la autoconciencia de la mama. Esto significa  reconocer la apariencia normal de sus mamas y cmo las siente.  Tambin significa realizar autoexmenes regulares de [employer]. Informe a su mdico sobre cualquier cambio, sin importar cun pequeo sea.  Si tiene entre 20 y 39 aos, un mdico debe realizarle un examen clnico de las mamas como parte del examen regular de [place], cada 1 a 3aos.  Si tiene 40aos o ms, debe Information systems manager clnico de las Microsoft. Tambin considere realizarse una [place] todos los [place].  Si tiene antecedentes familiares de cncer de mama, hable con su mdico para someterse a un estudio gentico.  Si tiene alto riesgo de Chief Financial Officer de mama, hable con su mdico para someterse a Public house manager y [employer].  La evaluacin del gen del cncer de mama (BRCA) se recomienda a mujeres que tengan familiares con cnceres relacionados con el BRCA. Los cnceres relacionados con el BRCA incluyen los siguientes: ? [place]. ? Ovario. ? Trompas. ? Cnceres de peritoneo.  Los resultados de la evaluacin determinarn la necesidad de asesoramiento gentico y de [place] de BRCA1 y BRCA2. Cncer de cuello del tero El mdico puede recomendarle que se haga pruebas peridicas de deteccin de cncer de los rganos de la pelvis (ovarios, tero y vagina). Estas pruebas incluyen un examen plvico, que abarca controlar si se produjeron cambios microscpicos en la  superficie del cuello del tero (prueba de Papanicolaou). Pueden recomendarle que se haga estas pruebas cada 3aos, a partir de los 21aos.  A las mujeres que tienen entre 30 y 52aos, los mdicos pueden recomendarles que se sometan a exmenes plvicos y pruebas de Papanicolaou cada 50aos, o a la prueba de Papanicolaou y el examen plvico en combinacin con estudios de deteccin del virus del papiloma humano (VPH) cada 5aos. Algunos tipos de VPH aumentan el riesgo de Chief Financial Officer de cuello del  tero. La prueba para la deteccin del VPH tambin puede realizarse a mujeres de cualquier edad cuyos resultados de la prueba de Papanicolaou no sean claros.  Es posible que otros mdicos no recomienden exmenes de deteccin a mujeres no embarazadas que se consideran sujetos de bajo riesgo de Chief Financial Officer de pelvis y que no tienen sntomas. Pregntele al mdico si un examen plvico de deteccin es adecuado para usted.  Si ha recibido un tratamiento para Science writer cervical o una enfermedad que podra causar cncer, necesitar realizarse una prueba de Papanicolaou y controles durante al menos 10 aos de concluido el [place]. Si no se ha hecho el Papanicolaou con regularidad, debern volver a evaluarse los factores de riesgo (como tener un nuevo compaero sexual), para Teacher, adult education si debe realizarse los estudios nuevamente. Algunas mujeres sufren problemas mdicos que aumentan la probabilidad de Museum/gallery curator cncer de cuello del tero. En estos casos, el mdico podr [employer] se realicen controles y pruebas de Papanicolaou con ms frecuencia. Cncer colorrectal  Este tipo de cncer puede detectarse y a menudo prevenirse.  Por lo general, los estudios de rutina se deben Medical laboratory scientific officer a Field seismologist a Proofreader de los 69 aos y [place] 36 aos.  Sin embargo, el mdico podr aconsejarle que lo haga antes, si tiene factores de riesgo para el cncer de colon.  Tambin puede recomendarle que use un kit de prueba para Hydrologist en la materia fecal.  Es posible que se use una pequea cmara en el extremo de un tubo para examinar directamente el colon (sigmoidoscopia o colonoscopia) a fin de Hydrographic surveyor formas tempranas de cncer colorrectal.  Los exmenes de rutina generalmente comienzan a los 44aos.  El examen directo del colon se debe repetir cada 5 a 10aos hasta los 75aos. Sin embargo, es posible que se realicen exmenes con mayor frecuencia, si se detectan formas tempranas de plipos precancerosos o  pequeos bultos. Cncer de piel  Revise la piel de la cabeza a los pies con regularidad.  Informe a su mdico si aparecen nuevos lunares o los que tiene se modifican, especialmente en su forma y color.  Tambin notifique al mdico si tiene un lunar que es ms grande que el tamao de una goma de lpiz.  Siempre use pantalla solar. Aplique pantalla solar de [place] D[place] y repetida a lo largo del Training and development officer.  Protjase usando mangas y [employer], un sombrero de ala ancha y gafas para el sol, siempre que se encuentre en el exterior. ENFERMEDADES CARDACAS, DIABETES E HIPERTENSIN ARTERIAL  La hipertensin arterial causa enfermedades cardacas y [place] el riesgo de ictus. La hipertensin arterial es ms probable en los siguientes casos: ? Las personas que tienen la presin arterial en el extremo del rango normal (100-139/85-89 mm Hg). ? [employer] con sobrepeso u obesidad. ? Scientist, water quality.  Si usted tiene entre 18 y 39 aos, debe medirse la presin arterial cada 3 a 5 aos. Si usted tiene 40 aos o ms, debe medirse la presin arterial  todos los aos. Debe medirse la presin arterial dos veces: una vez cuando est en un hospital o una clnica y la otra vez cuando est en otro sitio. Registre el promedio de [employer]. Para controlar su presin arterial cuando no est en un hospital o [place], puede usar lo siguiente: ? Jorje Guild automtica para medir la presin arterial en una farmacia. ? Un monitor para medir la presin arterial en el hogar.  Si tiene entre 21 y 71 aos, consulte a su mdico si debe tomar aspirina para prevenir el ictus.  Realcese exmenes de deteccin de la diabetes con regularidad. Esto incluye la toma de [place] de sangre para controlar el nivel de azcar en la sangre durante el [place]. ? Si tiene un peso normal y un bajo riesgo de padecer diabetes, realcese este anlisis cada tres aos despus de los 45aos. ? Si tiene sobrepeso y un  alto riesgo de padecer diabetes, considere someterse a este anlisis antes o con mayor frecuencia. PREVENCIN DE INFECCIONES HepatitisB  Si tiene un riesgo ms alto de Museum/gallery curator hepatitis B, debe someterse a un examen de deteccin de este virus. Se considera que tiene un alto riesgo de contraer hepatitis B si: ? Naci en un pas donde la hepatitis B es frecuente. Pregntele a su mdico qu pases son considerados de Public affairs consultant. ? Sus padres nacieron en un pas de alto riesgo y usted no recibi una vacuna que lo proteja contra la hepatitis B (vacuna contra la hepatitis B). ? [place]. ? [place] agujas para inyectarse drogas. ? Vive con alguien que tiene hepatitis B. ? Ha tenido sexo con alguien que tiene hepatitis B. ? Recibe tratamiento de hemodilisis. ? Toma ciertos medicamentos para el cncer, trasplante de rganos y afecciones autoinmunitarias. Hepatitis C  Se recomienda un anlisis de [place] para: ? [employer] 1945 y 1965. ? Todas las personas que tengan un riesgo de haber contrado hepatitis C. Enfermedades de transmisin sexual (ETS).  Debe realizarse pruebas de deteccin de enfermedades de transmisin sexual (ETS), incluidas gonorrea y clamidia si: ? Es sexualmente activo y es menor de 47MLY. ? Es mayor de 24aos, y Investment banker, operational informa que corre riesgo de tener este tipo de infecciones. ? La actividad sexual ha cambiado desde que le hicieron la ltima prueba de deteccin y tiene un riesgo mayor de Best boy clamidia o Radio broadcast assistant. Pregntele al mdico si usted tiene riesgo.  Si no tiene el VIH, pero corre riesgo de infectarse por el virus, se recomienda tomar diariamente un medicamento recetado para evitar la infeccin. Esto se conoce como profilaxis previa a la exposicin. Se considera que est en riesgo si: ? Es [place] sexualmente y no [place] preservativos habitualmente o no conoce el estado del VIH de sus Advertising copywriter. ? Se inyecta drogas. ? Es [place]  sexualmente con Ardelia Mems pareja que tiene VIH. Consulte a su mdico para saber si tiene un alto riesgo de infectarse por el VIH. Si opta por comenzar la profilaxis previa a la exposicin, primero debe realizarse anlisis de deteccin del VIH. Luego, le harn anlisis cada 10mses mientras est tomando los medicamentos para la profilaxis previa a la exposicin. [facility] Si es premenopusica y puede quedar [place] solicite a su mdico asesoramiento previo a la concepcin.  Si puede quedar embarazada, tome 400 a [id number](mcg) de cido fAnheuser-Busch  Si desea evitar el embarazo, hable con su mdico sobre el control de la natalidad (anticoncepcin).  OSTEOPOROSIS Y MENOPAUSIA  La osteoporosis es una enfermedad en la que los huesos pierden los minerales y la fuerza por el avance de la edad. El resultado pueden ser fracturas graves en los [place]. El riesgo de osteoporosis puede identificarse con Ardelia Mems prueba de densidad sea.  Si tiene 65aos o ms, o si est en riesgo de sufrir osteoporosis y fracturas, pregunte a su mdico si debe someterse a exmenes.  Consulte a su mdico si debe tomar un suplemento de calcio o de vitamina D para reducir el riesgo de osteoporosis.  La menopausia puede presentar ciertos sntomas fsicos y [place].  La terapia de reemplazo hormonal puede reducir algunos de estos sntomas y [place]. Consulte a su mdico para saber si la terapia de reemplazo hormonal es conveniente para usted. INSTRUCCIONES PARA EL CUIDADO EN EL HOGAR  Realcese los estudios de rutina de la salud, dentales y de Public librarian.  [place].  No consuma ningn producto que contenga tabaco, lo que incluye cigarrillos, tabaco de Higher education careers adviser o Psychologist, sport and exercise.  Si est embarazada, no beba alcohol.  Si est amamantando, reduzca el consumo de alcohol y la frecuencia con la que consume.  Si es mujer y no est embarazada limite el consumo de alcohol a no ms de 1  medida por da. Una medida equivale a 12onzas de cerveza, 5onzas de vino o 1onzas de bebidas alcohlicas de alta graduacin.  No consuma drogas.  No comparta agujas.  Solicite ayuda a su mdico si necesita apoyo o informacin para abandonar las drogas.  Informe a su mdico si a menudo se siente deprimido.  Notifique a su mdico si alguna vez ha sido vctima de abuso o si no se siente seguro en su hogar. Esta informacin no tiene Marine scientist el consejo del mdico. Asegrese de hacerle al mdico cualquier pregunta que tenga. Document Released: 06/11/2011 Document Revised: 07/13/2014 Document Reviewed: 03/26/2015 Elsevier Interactive Patient Education  [employer].

## 2017-09-02 ENCOUNTER — Other Ambulatory Visit: Payer: Self-pay | Admitting: Obstetrics & Gynecology

## 2017-09-02 DIAGNOSIS — Z139 Encounter for screening, unspecified: Secondary | ICD-10-CM

## 2017-09-02 LAB — PAP IG W/ RFLX HPV ASCU

## 2017-09-15 ENCOUNTER — Ambulatory Visit (INDEPENDENT_AMBULATORY_CARE_PROVIDER_SITE_OTHER): Payer: BLUE CROSS/BLUE SHIELD

## 2017-09-15 ENCOUNTER — Ambulatory Visit (INDEPENDENT_AMBULATORY_CARE_PROVIDER_SITE_OTHER): Payer: BLUE CROSS/BLUE SHIELD | Admitting: Obstetrics & Gynecology

## 2017-09-15 ENCOUNTER — Other Ambulatory Visit: Payer: Self-pay | Admitting: Obstetrics & Gynecology

## 2017-09-15 ENCOUNTER — Encounter: Payer: Self-pay | Admitting: Obstetrics & Gynecology

## 2017-09-15 DIAGNOSIS — N9489 Other specified conditions associated with female genital organs and menstrual cycle: Secondary | ICD-10-CM

## 2017-09-15 DIAGNOSIS — N83202 Unspecified ovarian cyst, left side: Secondary | ICD-10-CM

## 2017-09-15 DIAGNOSIS — N949 Unspecified condition associated with female genital organs and menstrual cycle: Secondary | ICD-10-CM

## 2017-09-15 DIAGNOSIS — R1900 Intra-abdominal and pelvic swelling, mass and lump, unspecified site: Secondary | ICD-10-CM

## 2017-09-15 NOTE — Progress Notes (Addendum)
    GENOA FREYRE 11/13/1970 [id number]        47 y.o.  [id number] Married. S/P TL.  From [facility]  3 daughters 06-23-22 yo.    RP:  Left Ovarian cyst for Pelvic US  HPI: No change x last visit on 08/31/2017 when we noted:  Menses every 1-2 months, normal light flow.  No pelvic pain.  Lumbar pain intermittently x many years.  Tenderness with IC, more towards the left.  Was supposed to have a LPS Lt Ovarian Cystectomy with Dr Toney Rakes at the end of 2017, but canceled because she lost her insurance.  Normal vaginal secretions.  C/O SUI, especially with a full bladder.  BMs wnl.  Breasts wnl.  BMI 29.37.   OB History  Gravida Para Term Preterm AB Living  4 3 3  0 1 3  SAB TAB Ectopic Multiple Live Births  0 1 0 0      # Outcome Date GA Lbr Len/2nd Weight Sex Delivery Anes PTL Lv  4 TAB           3 Term           2 Term           1 Term               Past medical history,surgical history, problem list, medications, allergies, family history and social history were all reviewed and documented in the EPIC chart.   Directed ROS with pertinent positives and negatives documented in the history of present illness/assessment and plan.  Exam:  There were no vitals filed for this visit. General appearance:  Normal  Pelvic US today: T/V and T/A images.  The uterus is anteverted and homogeneous.  It measures 11.02 x 6.39 x 5.93 cm.  The uterus is displaced due to a left adnexal mass.  Right ovary normal.  Left ovary with a rim of tissue seen and arterial and venous flow present.  The large left ovarian cyst measures 12.0 x 11.0 x 10.4 cm.  The mass is primarily cystic with negative color flow Doppler but thick septations are present with a small focal solid area measuring 7 mm.  No free fluid in the posterior cul-de-sac.   Assessment/Plan:  47 y.o. [id number]   1. Left ovarian cyst Left ovarian cyst increased in size x last Pelvic US 05/2016 and complex.  Will do Ova 1 and Ca  125 today.  Patient informed that the cyst appears to be a tumor which is more likely to be benign, but that cancer is not ruled out.  If Ca 125 and Ova 1 are negative, will proceed with XI Robotic Left Oophorectomy with Bilateral Salpingectomy.  Patient desires preservation of her Rt Ovary as she is 47 yo and non-menopausal.  Surgery and risks reviewed.  If Ca 125 and Ova 1 are positive, will refer to Gyneco-Onco.  F/U Preop visit. - Ova 1 - CA 125  Counseling and coordination of care on above issue >50% x 15 minutes.  Princess Bruins MD, 11:28 AM 09/15/2017

## 2017-09-16 LAB — CA 125: CA 125: 16 U/mL (ref <35)

## 2017-09-17 ENCOUNTER — Ambulatory Visit
Admission: RE | Admit: 2017-09-17 | Discharge: 2017-09-17 | Disposition: A | Discharge status: Home / Self Care | Payer: BLUE CROSS/BLUE SHIELD | Source: Ambulatory Visit | Attending: Obstetrics & Gynecology | Admitting: Obstetrics & Gynecology

## 2017-09-17 DIAGNOSIS — Z139 Encounter for screening, unspecified: Secondary | ICD-10-CM

## 2017-09-20 ENCOUNTER — Encounter: Payer: Self-pay | Admitting: Obstetrics & Gynecology

## 2017-09-20 NOTE — Patient Instructions (Signed)
1. Left ovarian cyst Left ovarian cyst increased in size x last Pelvic US 05/2016 and complex.  Will do Ova 1 and Ca 125 today.  Patient informed that the cyst appears to be a tumor which is more likely to be benign, but that cancer is not ruled out.  If Ca 125 and Ova 1 are negative, will proceed with XI Robotic Left Oophorectomy with Bilateral Salpingectomy.  Patient desires preservation of her Rt Ovary as she is 47 yo fornon-menopausal.  Surgery and risks reviewed.  If Ca 125 and Ova 1 are positive, will refer to Gyneco-Onco.  F/U Preop visit. - Ova 1 - CA 125  [place], fue un placer conocerle hoy!  Voy a informarle de sus [employer].     Ooferectoma unilateral y salpingectomia bilateral (Unilateral Oophorectomy and bilateral Salpingectomy) La oferectoma unilateral es la remocin [place] de un ovario y la remocion bilateral de los trompas. Los ovarios son los rganos pequeos que producen vulos en las mujeres. Las trompas de Falopio son los conductos que transportan los vulos desde los ovarios hasta la matriz (tero). Una oferectoma unilateral puede indicarse por diferentes motivos, entre ellos:  Infeccin en la trompa de Falopio y [place].  Tejido cicatrizal en la trompa de Falopio y el ovario (adherencias).  Quiste o tumor en el ovario.  Necesidad de extirpar la trompa de Falopio y el ovario al eliminar el tero.  Cncer de la trompa de Falopio u rganos cercanos. La extirpacin de [employer] trompas de Falopio y un ovario evitar que quede [place], no la har entrar en la menopausia ni causar problemas con los perodos menstruales ni el impulso sexual. Is INFORME A SU MDICO:  Cualquier alergia que tenga.  Todos los [employer] [place], incluyendo vitaminas, hierbas, gotas oftlmicas, cremas y medicamentos de venta libre.  Problemas previos que usted o los [employer] de su familia hayan tenido con el uso de anestsicos.  Enfermedades de [employer].  Cirugas previas.  Padecimientos mdicos. RIESGOS Y COMPLICACIONES  Generalmente es un procedimiento seguro. Sin embargo, Games developer procedimiento, pueden surgir complicaciones. Las complicaciones posibles son:  [employer] rganos circundantes.  Hemorragias.  Infeccin.  Cogulos de [employer] piernas o los pulmones.  Problemas relacionados con la anestesia. ANTES DEL PROCEDIMIENTO  Consulte a su mdico si debe cambiar o suspender los medicamentos que toma habitualmente. Es posible que deba dejar de tomar ciertos medicamentos antes de la [place].  No debe comer ni beber nada durante al menos 8 horas antes de la [place].  Si fuma, no lo haga al [employer] previas a la [place].  Haga arreglos para que alguien lo lleve a su casa despus del procedimiento o de la hospitalizacin. Tambin pdale a alguna persona que lo ayude con sus actividades mientras se recupera. PROCEDIMIENTO  Antes del procedimiento le darn un medicamento para que pueda relajarse (sedante). Le administrarn medicamentos para hacerlo dormir durante el procedimiento (anestesia general). Este medicamento se aplica a travs de una va intravenosa (IV) que se coloca en una vena.  Cuando est dormida, le higienizarn y rasurarn el abdomen. Le colocarn un tubo flexible (catter)en la vejiga.  El [place] podr usar una tcnica laparoscpica o Ardelia Mems ciruga abierta. ? En la tcnica laparoscpica, la ciruga se realiza a travs de dos pequeos cortes (incisiones) en el abdomen. Se inserta un tubo delgado que emite luz y que posee una cmara (laparoscopio) en una de las incisiones. A travs de estos  tubos se coloca el instrumental necesario para los procedimientos. ? Podr optarse por una tcnica robtica para realizar una ciruga compleja en un espacio pequeo. En la tcnica robtica se realizan pequeas incisiones. A travs de las incisiones se insertar una cmara e instrumentos  quirrgicos. Los instrumentos quirrgicos se controlarn con la ayuda de un brazo robtico. ? En la tcnica abierta, la [place] se realiza a travs de una gran incisin en el abdomen.  Al utilizar cualquiera de estas tcnicas, el cirujano extirpa la trompa de Falop[place] y [place]. Los vasos sanguneos se pinzarn y Armed forces operational officer.  El cirujano cerrar la incisin con grapas o puntos de sutura. DESPUS DEL PROCEDIMIENTO  La llevarn al rea de recuperacin donde controlarn su evolucin durante 1 a 3 horas. Le controlarn la presin arterial y el pulso con frecuencia. Permanecer en la sala de recuperacin hasta que se encuentre estable y se despierte.  Si utilizaron la tcnica laparoscpica, Chief Financial Officer a su casa despus de algunas horas. Es posible que sienta dolor en el hombro. Esto es normal y Art gallery manager en un [place].  Si se [place] la tcnica Scientist, research (life sciences) hospital durante un par Ruth.  Le darn medicamentos para el dolor si los necesita.  Le quitarn la va IV y el catter antes de darle el alta. Esta informacin no tiene Marine scientist el consejo del mdico. Asegrese de hacerle al mdico cualquier pregunta que tenga. Document Released: 04/18/2009 Document Revised: 06/27/2013 Elsevier Interactive Patient Education  2017 [employer].

## 2017-09-29 ENCOUNTER — Telehealth: Payer: Self-pay

## 2017-09-29 NOTE — Telephone Encounter (Signed)
The [facility] Pres[facility] nurse called me to say that when she called [name] to schedule her pre op appt that patient told her she was not going to be able to have surgery due to a family emergency and she was having to leave the country.  I called the patient and she did verify this. She asked me to cancel her surgery. She said her FIL is dying and not sure how long she will be away. She said she will call me when she returns to reschedule her surgery. Case was cancelled and office schedules opened. Front desk was informed.

## 2017-09-30 ENCOUNTER — Ambulatory Visit: Payer: BLUE CROSS/BLUE SHIELD | Admitting: Obstetrics & Gynecology

## 2017-09-30 ENCOUNTER — Encounter: Payer: Self-pay | Admitting: Anesthesiology

## 2017-10-06 ENCOUNTER — Ambulatory Visit (HOSPITAL_COMMUNITY): Admission: RE | Admit: 2017-10-06 | Payer: Self-pay | Source: Ambulatory Visit | Admitting: Obstetrics & Gynecology

## 2017-10-06 ENCOUNTER — Encounter (HOSPITAL_COMMUNITY): Admission: RE | Payer: Self-pay | Source: Ambulatory Visit

## 2017-10-06 SURGERY — SALPINGO-OOPHORECTOMY, BILATERAL, ROBOT-ASSISTED
Anesthesia: General

## 2017-11-22 ENCOUNTER — Telehealth: Payer: Self-pay

## 2017-11-22 NOTE — Telephone Encounter (Signed)
Patient called in voice mail stating she is ready to schedule her surgery again. She cancelled back in March.  I called her and per DPR access note on file I left detailed message regarding surgery prepymt remains same as estimate in March 2019. Also, let her three available upcoming dates to chose from. Asked her to call me when she can.

## 2017-11-23 NOTE — Telephone Encounter (Signed)
Patient called back. We reviewed her ins benefits and her estimated surgery prepayment by one week prior to surgery. We discussed dates and scheduled her for 01/03/18 at 7:30am at [facility].  Pre op appt was scheduled with Dr. Marguerita Merles. Financial letter will be mailed to her.

## 2017-12-04 DIAGNOSIS — L237 Allergic contact dermatitis due to plants, except food: Secondary | ICD-10-CM

## 2017-12-04 HISTORY — DX: Allergic contact dermatitis due to plants, except food: L23.7

## 2017-12-06 ENCOUNTER — Telehealth: Payer: Self-pay

## 2017-12-06 ENCOUNTER — Encounter: Payer: Self-pay | Admitting: Anesthesiology

## 2017-12-06 NOTE — Telephone Encounter (Signed)
Patient called asking to move her surgery up earlier due to discomfort she is having at work with standing.  She asked about moving her surgery up earlier. I explained that is our first block date but I will look for public time and see if sooner available. I explained not likely to be a lot sooner even if I can find this time. Patient asked if I could not schedule it as an emergency for tomorrow or this week. I explained I cannot do that and if she thinks it is emergency she needs to come see her MD. Transferred to [facility] to schedule appointment.

## 2017-12-08 ENCOUNTER — Encounter: Payer: Self-pay | Admitting: Obstetrics & Gynecology

## 2017-12-08 ENCOUNTER — Ambulatory Visit: Payer: BLUE CROSS/BLUE SHIELD | Admitting: Obstetrics & Gynecology

## 2017-12-08 VITALS — BP 134/88

## 2017-12-08 DIAGNOSIS — R1084 Generalized abdominal pain: Secondary | ICD-10-CM | POA: Diagnosis not present

## 2017-12-08 DIAGNOSIS — N83202 Unspecified ovarian cyst, left side: Secondary | ICD-10-CM

## 2017-12-08 NOTE — Progress Notes (Signed)
[name] 16-Jul-1970 [id number]        47 y.o.  [id number] Married  RP: Left Ovarian Cyst for Preop visit  HPI: Patient was last seen on September 15, 2017 and did not schedule her surgery until early July 2019 because she was traveling and out of the country.  Complains of generalized abdominal discomfort with loss of appetite and occasional vomiting.  Normal regular periods with last menstrual period December 04, 2017.  No abnormal vaginal discharge.  No fever.  Urine and bowel movements normal.   OB History  Gravida Para Term Preterm AB Living  4 3 3  0 1 3  SAB TAB Ectopic Multiple Live Births  0 1 0 0      # Outcome Date GA Lbr Len/2nd Weight Sex Delivery Anes PTL Lv  4 TAB           3 Term           2 Term           1 Term             Past medical history,surgical history, problem list, medications, allergies, family history and social history were all reviewed and documented in the EPIC chart.   Directed ROS with pertinent positives and negatives documented in the history of present illness/assessment and plan.  Exam:  Vitals:   12/08/17 1412  BP: 134/88   General appearance:  Normal  Abdomen: Soft and nondistended.  No rebound.  Tender at the lower abdomen.  Gynecologic exam: Vulva normal.  Bimanual exam: Large left ovarian mass unchanged since last evaluation.  Tender left adnexa.  Pelvic US 09/15/2017: T/V and T/A images.  The uterus is anteverted and homogeneous.  It measures 11.02 x 6.39 x 5.93 cm.  The uterus is displaced due to a left adnexal mass.  Right ovary normal.  Left ovary with a rim of tissue seen and arterial and venous flow present.  The large left ovarian cyst measures 12.0 x 11.0 x 10.4 cm.  The mass is primarily cystic with negative color flow Doppler but thick septations are present with a small focal solid area measuring 7 mm.  No free fluid in the posterior cul-de-sac.  Ca 125 normal and stable at 16 on 09/15/2017.   Assessment/Plan:  47  y.o. [id number]   1. Left ovarian cyst Large left ovarian cyst measuring 12 cm negative color flow Doppler but septations present with a small solid area measuring 7 mm.  No free fluid in the posterior cul-de-sac.  Ca125 normal.  Decision to proceed with X I robotic left salpingo-oophorectomy and right salpingectomy with peritoneal washings.  Surgery, risks and benefits reviewed with patient.  Risks of trauma, infection, hemorrhage, anesthesia complication, DVT/pulmonary embolism reviewed with patient.  The small risk that the final pathology would show an ovarian cancer discussed.  Patient understands that in that case she would be referred to a gynecologic oncologist for further management and treatment.  2. Generalized abdominal pain Generalized abdominal discomfort probably of gastrointestinal origin given that the patient occasionally feels nauseated and has had vomiting.  Will schedule an appointment promptly with her family physician to investigate prior to surgery.                        Patient was counseled as to the risk of surgery to include the following:  1. Infection (prohylactic antibiotics will be administered)  2. DVT/Pulmonary Embolism (prophylactic pneumo compression  stockings will be used)  3.Trauma to internal organs requiring additional surgical procedure to repair any injury to internal organs requiring perhaps additional hospitalization days.  4.Hemmorhage requiring transfusion and blood products which carry risks such as anaphylactic reaction, hepatitis and AIDS  Patient had received literature information on the procedure scheduled and all her questions were answered and fully accepts all risk.  Counseling on above issues and coordination of care more than 50% for 25 minutes.  Princess Bruins MD, 2:17 PM 12/08/2017

## 2017-12-09 ENCOUNTER — Telehealth: Payer: Self-pay

## 2017-12-09 NOTE — Telephone Encounter (Signed)
Per note from Dr. Marguerita Merles I called patient and left detailed message in voice mail per DPR access note on file to let her know we are not able to advance her surgery so will leave it for 01/03/18.  I told patient that note I had from Dr. Marguerita Merles said that she wanted patient to see PCP regarding GI issues prior to surgery. I asked patient to call me so I can assist her with getting appt asap.

## 2017-12-11 ENCOUNTER — Encounter: Payer: Self-pay | Admitting: Obstetrics & Gynecology

## 2017-12-11 NOTE — Patient Instructions (Signed)
1. Left ovarian cyst Large left ovarian cyst measuring 12 cm negative color flow Doppler but septations present with a small solid area measuring 7 mm.  No free fluid in the posterior cul-de-sac.  Ca125 normal.  Decision to proceed with X I robotic left salpingo-oophorectomy and right salpingectomy with peritoneal washings.  Surgery, risks and benefits reviewed with patient.  Risks of trauma, infection, hemorrhage, anesthesia complication, DVT/pulmonary embolism reviewed with patient.  The small risk that the final pathology would show an ovarian cancer discussed.  Patient understands that in that case she would be referred to a gynecologic oncologist for further management and treatment.  2. Generalized abdominal pain Generalized abdominal discomfort probably of gastrointestinal origin given that the patient occasionally feels nauseated and has had vomiting.  Will schedule an appointment promptly with her family physician to investigate prior to surgery.                        Patient was counseled as to the risk of surgery to include the following:  1. Infection (prohylactic antibiotics will be administered)  2. DVT/Pulmonary Embolism (prophylactic pneumo compression stockings will be used)  3.Trauma to internal organs requiring additional surgical procedure to repair any injury to internal organs requiring perhaps additional hospitalization days.  4.Hemmorhage requiring transfusion and blood products which carry risks such as anaphylactic reaction, hepatitis and AIDS  Patient had received literature information on the procedure scheduled and all her questions were answered and fully accepts all risk.  [name], it was a pleasure seeing you today!  Make sure you see your family physician to investigate your abdominal discomfort associated with nausea and vomiting prior to the surgery.

## 2017-12-15 NOTE — Telephone Encounter (Signed)
Left another message to call me so I can assist with scheduling with PCP to assess GI issues prior to surgery per Dr. Mariah Milling request.

## 2017-12-15 NOTE — Telephone Encounter (Signed)
Spoke with patient. She has a new patient appt at [facility] PCP on 12/21/17.

## 2017-12-21 ENCOUNTER — Encounter: Payer: Self-pay | Admitting: Family Medicine

## 2017-12-21 ENCOUNTER — Other Ambulatory Visit: Payer: Self-pay

## 2017-12-21 ENCOUNTER — Ambulatory Visit (INDEPENDENT_AMBULATORY_CARE_PROVIDER_SITE_OTHER): Payer: BLUE CROSS/BLUE SHIELD | Admitting: Family Medicine

## 2017-12-21 ENCOUNTER — Telehealth: Payer: Self-pay

## 2017-12-21 VITALS — BP 120/80 | HR 76 | Temp 98.5°F | Ht 62.4 in | Wt 160.0 lb

## 2017-12-21 DIAGNOSIS — K219 Gastro-esophageal reflux disease without esophagitis: Secondary | ICD-10-CM | POA: Diagnosis not present

## 2017-12-21 DIAGNOSIS — L237 Allergic contact dermatitis due to plants, except food: Secondary | ICD-10-CM | POA: Diagnosis not present

## 2017-12-21 MED ORDER — PREDNISONE 10 MG PO TABS: ORAL_TABLET | ORAL | 0 refills | Status: AC

## 2017-12-21 NOTE — Progress Notes (Signed)
6/18/20195:24 PM  [name] Nov 18, 1970, 47 y.o. female [id number]  Chief Complaint  Patient presents with  . Pruritis    has had poison ivy for 3 wks, taking the medication which is not working for th sympoms, prednisone and benedryl  with topical creams. Having surgery on the 1 st to reove cyst on left ovary    HPI:   Patient is a 47 y.o. female who presents today for worsening poison ivy rash  She was seen on 12/21/17 for poison ivy rash after working in her yard Given oral pred and atarax Was getting better while on oral pred but completed course about 2 days ago and rash is coming back, very itchy She otherwise denies any side effects of med  She is also requesting referral to GI She has long standing problems with GERD, traditionally controlled on PPI and diet Of recent has been worsening, she feels as is not digesting her food at all  She is bloated and reports dysphagia Denies any vomiting, she does have LLQ pain but has known ovarian mass, planned surgery on 7/1 She denies any black tarry stools or unintentional weight loss She denies any fever or chills  Fall Risk  12/21/2017  Falls in the past year? No     Depression screen PHQ 2/9 12/21/2017  Decreased Interest 0  Down, Depressed, Hopeless 0  PHQ - 2 Score 0    Allergies  Allergen Reactions  . Fish Allergy Nausea And Vomiting    Prior to Admission medications   Medication Sig Start Date End Date Taking? Authorizing Provider  acetaminophen (TYLENOL) 325 MG tablet Take 325-650 mg by mouth every 6 (six) hours as needed for moderate pain or headache.   Yes [provider]  albuterol (PROVENTIL HFA;VENTOLIN HFA) 108 (90 Base) MCG/ACT inhaler Inhale 2-3 puffs into the lungs every 6 (six) hours as needed for wheezing or shortness of breath.   Yes [provider]  cetirizine (ZYRTEC ALLERGY) 10 MG tablet Take 10 mg by mouth daily as needed for allergies.    Yes [provider]    hydrOXYzine (ATARAX/VISTARIL) 25 MG tablet Take 25 mg by mouth 3 (three) times daily as needed for itching. 12/13/17  Yes [provider]  ibuprofen (ADVIL,MOTRIN) 200 MG tablet Take 200-400 mg by mouth every 6 (six) hours as needed for headache or moderate pain.    Yes [provider]  omeprazole (PRILOSEC) 10 MG capsule Take 10 mg by mouth daily.   Yes [provider]  predniSONE (DELTASONE) 10 MG tablet Take 10 mg by mouth daily with breakfast.   Yes [provider]  triamcinolone (KENALOG) 0.025 % cream Apply 1 application topically 2 (two) times daily.   Yes [provider]    Past Medical History:  Diagnosis Date  . Asthma    only in Spring- allergic to pollens  . Hypertension   . Vitamin D deficiency     Past Surgical History:  Procedure Laterality Date  . BREAST SURGERY  2010   breast reduction  . CESAREAN SECTION    . LAPAROSCOPIC TUBAL LIGATION Bilateral 08/22/2012   Procedure: LAPAROSCOPIC TUBAL LIGATION;  Surgeon: Woodroe Mode, MD;  Location: [facility];  Service: Gynecology;  Laterality: Bilateral;  IUD removal  . LIPOSUCTION TRUNK  2010  . REDUCTION MAMMAPLASTY      Social History   Tobacco Use  . Smoking status: Never Smoker  . Smokeless tobacco: Never Used  Substance Use Topics  .  Alcohol use: No    Family History  Problem Relation Age of Onset  . Heart disease Father   . Diabetes Sister   . Hypertension Sister   . Hypertension Brother   . Liver disease Mother   . Breast cancer Maternal Aunt     ROS Per hpi  OBJECTIVE:  Blood pressure 120/80, pulse 76, temperature 98.5 F (36.9 C), temperature source Oral, height 5' 2.4" (1.585 m), weight 160 lb (72.6 kg), last menstrual period 12/04/2017, SpO2 100 %.  Physical Exam  Constitutional: She is oriented to person, place, and time. She appears well-developed and well-nourished.  HENT:  Head: Normocephalic and atraumatic.  Mouth/Throat: Oropharynx is clear  and moist. No oropharyngeal exudate.  Eyes: Pupils are equal, round, and reactive to light. EOM are normal. No scleral icterus.  Neck: Neck supple.  Cardiovascular: Normal rate, regular rhythm and normal heart sounds. Exam reveals no gallop and no friction rub.  No murmur heard. Pulmonary/Chest: Effort normal and breath sounds normal. She has no wheezes. She has no rales.  Abdominal: Soft. Bowel sounds are normal. She exhibits no distension. There is no hepatosplenomegaly. There is tenderness (LLQ). There is no rebound and no guarding.  Musculoskeletal: She exhibits no edema.  Neurological: She is alert and oriented to person, place, and time.  Skin: Skin is warm and dry. Rash (erythematous patches, lines, in varying directions and distributions. ) noted.  Psychiatric: She has a normal mood and affect.  Nursing note and vitals reviewed.     ASSESSMENT and PLAN  1. Poison ivy dermatitis Extending oral steroid treatment given return of rash and multiple areas pf distribution. Cont with atarax for itchiness. Reassured patient that this will eventually resolve.  2. Gastroesophageal reflux disease, esophagitis presence not specified - Ambulatory referral to Gastroenterology    Other orders - triamcinolone (KENALOG) 0.025 % cream; Apply 1 application topically 2 (two) times daily. - omeprazole (PRILOSEC) 10 MG capsule; Take 10 mg by mouth daily. - predniSONE (DELTASONE) 10 MG tablet; Take 4 tablets (40 mg total) by mouth daily with breakfast for 2 days, THEN 3 tablets (30 mg total) daily with breakfast for 1 day, THEN 2 tablets (20 mg total) daily with breakfast for 1 day, THEN 1 tablet (10 mg total) daily with breakfast for 1 day.  Return in about 6 weeks (around 02/01/2018) for annual .    Rutherford Guys, MD [facility] [place], [place] [address] Ph.  [phone] Fax [phone]

## 2017-12-21 NOTE — Patient Instructions (Signed)
     IF you received an x-ray today, you will receive an invoice from [facility]. Please contact [facility] at [phone] with questions or concerns regarding your invoice.   IF you received labwork today, you will receive an invoice from [facility]. Please contact [facility] at [phone] with questions or concerns regarding your invoice.   Our billing staff will not be able to assist you with questions regarding bills from these companies.  You will be contacted with the lab results as soon as they are available. The fastest way to get your results is to activate your My Chart account. Instructions are located on the last page of this paperwork. If you have not heard from us regarding the results in 2 weeks, please contact this office.     

## 2017-12-21 NOTE — Telephone Encounter (Signed)
Tyna Jaksch with [employer] had inquired about observing a Robot case for learning purproses.  Dr. Dellis Filbert said she was fine with her attending the case if okay with the patient. I called the patient and explained and she said she was fine with this. [place] was informed by email.

## 2017-12-22 ENCOUNTER — Encounter (HOSPITAL_COMMUNITY): Payer: Self-pay

## 2017-12-22 NOTE — Patient Instructions (Signed)
Your procedure is scheduled on: Monday, January 03, 2018   Surgery Time:  7:30Am-9:30AM   Report to [place]  Entrance    Report to admitting at 5:30 AM   Call this number if you have problems the morning of surgery [phone]   Do not eat food or drink liquids :After Midnight.   Do NOT smoke after Midnight   Take these medicines the morning of surgery with A SIP OF WATER: Omeprazole, Cetirizine if need   Bring Asthma Inhaler day of surgery                               You may not have any metal on your body including hair pins, jewelry, and body piercings             Do not wear make-up, lotions, powders, perfumes/cologne, or deodorant             Do not wear nail polish.  Do not shave  48 hours prior to surgery.              Do not bring valuables to the hospital. [place].   Contacts, dentures or bridgework may not be worn into surgery.    Patients discharged the day of surgery will not be allowed to drive home.   Special Instructions: Bring a copy of your healthcare power of attorney and living will documents         the day of surgery if you haven't scanned them in before.              Please read over the following fact sheets you were given:  [facility] - Preparing for Surgery Before surgery, you can play an important role.  Because skin is not sterile, your skin needs to be as free of germs as possible.  You can reduce the number of germs on your skin by washing with CHG (chlorahexidine gluconate) soap before surgery.  CHG is an antiseptic cleaner which kills germs and bonds with the skin to continue killing germs even after washing. Please DO NOT use if you have an allergy to CHG or antibacterial soaps.  If your skin becomes reddened/irritated stop using the CHG and inform your nurse when you arrive at Short Stay. Do not shave (including legs and underarms) for at least 48 hours prior to the first  CHG shower.  You may shave your face/neck.  Please follow these instructions carefully:  1.  Shower with CHG Soap the night before surgery and the  morning of surgery.  2.  If you choose to wash your hair, wash your hair first as usual with your normal  shampoo.  3.  After you shampoo, rinse your hair and body thoroughly to remove the shampoo.                             4.  Use CHG as you would any other liquid soap.  You can apply chg directly to the skin and wash.  Gently with a scrungie or clean washcloth.  5.  Apply the CHG Soap to your body ONLY FROM THE NECK DOWN.   Do   not use on face/ open  Wound or open sores. Avoid contact with eyes, ears mouth and   genitals (private parts).                       Wash face,  Genitals (private parts) with your normal soap.             6.  Wash thoroughly, paying special attention to the area where your    surgery  will be performed.  7.  Thoroughly rinse your body with warm water from the neck down.  8.  DO NOT shower/wash with your normal soap after using and rinsing off the CHG Soap.                9.  Pat yourself dry with a clean towel.            10.  Wear clean pajamas.            11.  Place clean sheets on your bed the night of your first shower and do not  sleep with pets. Day of Surgery : Do not apply any lotions/deodorants the morning of surgery.  Please wear clean clothes to the hospital/surgery center.  FAILURE TO FOLLOW THESE INSTRUCTIONS MAY RESULT IN THE CANCELLATION OF YOUR SURGERY  PATIENT SIGNATURE_________________________________  NURSE SIGNATURE__________________________________  ________________________________________________________________________   Adam Phenix  An incentive spirometer is a tool that can help keep your lungs clear and active. This tool measures how well you are filling your lungs with each breath. Taking long deep breaths may help reverse or decrease the chance of  developing breathing (pulmonary) problems (especially infection) following:  A long period of time when you are unable to move or be active. BEFORE THE PROCEDURE   If the spirometer includes an indicator to show your best effort, your nurse or respiratory therapist will set it to a desired goal.  If possible, sit up straight or lean slightly forward. Try not to slouch.  Hold the incentive spirometer in an upright position. INSTRUCTIONS FOR USE  1. Sit on the edge of your bed if possible, or sit up as far as you can in bed or on a chair. 2. Hold the incentive spirometer in an upright position. 3. Breathe out normally. 4. Place the mouthpiece in your mouth and seal your lips tightly around it. 5. Breathe in slowly and as deeply as possible, raising the piston or the ball toward the top of the column. 6. Hold your breath for 3-5 seconds or for as long as possible. Allow the piston or ball to fall to the bottom of the column. 7. Remove the mouthpiece from your mouth and breathe out normally. 8. Rest for a few seconds and repeat Steps 1 through 7 at least 10 times every 1-2 hours when you are awake. Take your time and take a few normal breaths between deep breaths. 9. The spirometer may include an indicator to show your best effort. Use the indicator as a goal to work toward during each repetition. 10. After each set of 10 deep breaths, practice coughing to be sure your lungs are clear. If you have an incision (the cut made at the time of surgery), support your incision when coughing by placing a pillow or rolled up towels firmly against it. Once you are able to get out of bed, walk around indoors and cough well. You may stop using the incentive spirometer when instructed by your caregiver.  RISKS AND COMPLICATIONS  Take your time  so you do not get dizzy or light-headed.  If you are in pain, you may need to take or ask for pain medication before doing incentive spirometry. It is harder to take a  deep breath if you are having pain. AFTER USE  Rest and breathe slowly and easily.  It can be helpful to keep track of a log of your progress. Your caregiver can provide you with a simple table to help with this. If you are using the spirometer at home, follow these instructions: [place] IF:   You are having difficultly using the spirometer.  You have trouble using the spirometer as often as instructed.  Your pain medication is not giving enough relief while using the spirometer.  You develop fever of 100.5 F (38.1 C) or higher. SEEK IMMEDIATE MEDICAL CARE IF:   You cough up bloody sputum that had not been present before.  You develop fever of 102 F (38.9 C) or greater.  You develop worsening pain at or near the incision site. MAKE SURE YOU:   Understand these instructions.  Will watch your condition.  Will get help right away if you are not doing well or get worse. Document Released: 11/02/2006 Document Revised: 09/14/2011 Document Reviewed: 01/03/2007 ExitCare Patient Information 2014 ExitCare, [place].   ________________________________________________________________________  WHAT IS A BLOOD TRANSFUSION? Blood Transfusion Information  A transfusion is the replacement of blood or some of its parts. Blood is made up of multiple cells which provide different functions.  Red blood cells carry oxygen and are used for blood loss replacement.  White blood cells fight against infection.  Platelets control bleeding.  Plasma helps clot blood.  Other blood products are available for specialized needs, such as hemophilia or other clotting disorders. BEFORE THE TRANSFUSION  Who gives blood for transfusions?   Healthy volunteers who are fully evaluated to make sure their blood is safe. This is blood bank blood. Transfusion therapy is the safest it has ever been in the practice of medicine. Before blood is taken from a donor, a complete history is taken to make  sure that person has no history of diseases nor engages in risky social behavior (examples are intravenous drug use or sexual activity with multiple partners). The donor's travel history is screened to minimize risk of transmitting infections, such as malaria. The donated blood is tested for signs of infectious diseases, such as HIV and hepatitis. The blood is then tested to be sure it is compatible with you in order to minimize the chance of a transfusion reaction. If you or a relative donates blood, this is often done in anticipation of surgery and is not appropriate for emergency situations. It takes many days to process the donated blood. RISKS AND COMPLICATIONS Although transfusion therapy is very safe and saves many lives, the main dangers of transfusion include:   Getting an infectious disease.  Developing a transfusion reaction. This is an allergic reaction to something in the blood you were given. Every precaution is taken to prevent this. The decision to have a blood transfusion has been considered carefully by your caregiver before blood is given. Blood is not given unless the benefits outweigh the risks. AFTER THE TRANSFUSION  Right after receiving a blood transfusion, you will usually feel much better and more energetic. This is especially true if your red blood cells have gotten low (anemic). The transfusion raises the level of the red blood cells which carry oxygen, and this usually causes an energy increase.  The  nurse administering the transfusion will monitor you carefully for complications. HOME CARE INSTRUCTIONS  No special instructions are needed after a transfusion. You may find your energy is better. Speak with your caregiver about any limitations on activity for underlying diseases you may have. SEEK MEDICAL CARE IF:   Your condition is not improving after your transfusion.  You develop redness or irritation at the intravenous (IV) site. SEEK IMMEDIATE MEDICAL CARE IF:   Any of the following symptoms occur over the next 12 hours:  Shaking chills.  You have a temperature by mouth above 102 F (38.9 C), not controlled by medicine.  Chest, back, or muscle pain.  People around you feel you are not acting correctly or are confused.  Shortness of breath or difficulty breathing.  Dizziness and fainting.  You get a rash or develop hives.  You have a decrease in urine output.  Your urine turns a dark color or changes to pink, red, or brown. Any of the following symptoms occur over the next 10 days:  You have a temperature by mouth above 102 F (38.9 C), not controlled by medicine.  Shortness of breath.  Weakness after normal activity.  The white part of the eye turns yellow (jaundice).  You have a decrease in the amount of urine or are urinating less often.  Your urine turns a dark color or changes to pink, red, or brown. Document Released: 06/19/2000 Document Revised: 09/14/2011 Document Reviewed: 02/06/2008 [facility] Patient Information 2014 [place], [place].  _______________________________________________________________________

## 2017-12-22 NOTE — Pre-Procedure Instructions (Signed)
The following are in epic: Last office visit note Dr. Pamella Pert 12/21/2017

## 2017-12-23 ENCOUNTER — Ambulatory Visit: Payer: BLUE CROSS/BLUE SHIELD | Admitting: Obstetrics & Gynecology

## 2017-12-24 ENCOUNTER — Other Ambulatory Visit: Payer: Self-pay

## 2017-12-24 ENCOUNTER — Encounter (HOSPITAL_COMMUNITY): Payer: Self-pay

## 2017-12-24 ENCOUNTER — Encounter (HOSPITAL_COMMUNITY)
Admission: RE | Admit: 2017-12-24 | Discharge: 2017-12-24 | Disposition: A | Discharge status: Home / Self Care | Payer: BLUE CROSS/BLUE SHIELD | Source: Ambulatory Visit | Attending: Obstetrics & Gynecology | Admitting: Obstetrics & Gynecology

## 2017-12-24 DIAGNOSIS — Z01812 Encounter for preprocedural laboratory examination: Secondary | ICD-10-CM | POA: Insufficient documentation

## 2017-12-24 DIAGNOSIS — L299 Pruritus, unspecified: Secondary | ICD-10-CM | POA: Insufficient documentation

## 2017-12-24 DIAGNOSIS — Z01810 Encounter for preprocedural cardiovascular examination: Secondary | ICD-10-CM | POA: Insufficient documentation

## 2017-12-24 DIAGNOSIS — R001 Bradycardia, unspecified: Secondary | ICD-10-CM | POA: Insufficient documentation

## 2017-12-24 DIAGNOSIS — Z79899 Other long term (current) drug therapy: Secondary | ICD-10-CM | POA: Insufficient documentation

## 2017-12-24 DIAGNOSIS — I10 Essential (primary) hypertension: Secondary | ICD-10-CM | POA: Insufficient documentation

## 2017-12-24 DIAGNOSIS — K219 Gastro-esophageal reflux disease without esophagitis: Secondary | ICD-10-CM | POA: Diagnosis not present

## 2017-12-24 HISTORY — DX: Adverse effect of unspecified anesthetic, initial encounter: T41.45XA

## 2017-12-24 HISTORY — DX: Gastro-esophageal reflux disease without esophagitis: K21.9

## 2017-12-24 HISTORY — DX: Stress incontinence (female) (male): N39.3

## 2017-12-24 HISTORY — DX: Headache: R51

## 2017-12-24 HISTORY — DX: Allergic contact dermatitis due to plants, except food: L23.7

## 2017-12-24 HISTORY — DX: Other specified conditions associated with female genital organs and menstrual cycle: N94.89

## 2017-12-24 HISTORY — DX: Other complications of anesthesia, initial encounter: T88.59XA

## 2017-12-24 HISTORY — DX: Headache, unspecified: R51.9

## 2017-12-24 LAB — BASIC METABOLIC PANEL
Anion gap: 6 (ref 5–15)
BUN: 17 mg/dL (ref 6–20)
CO2: 30 mmol/L (ref 22–32)
Calcium: 9 mg/dL (ref 8.9–10.3)
Chloride: 107 mmol/L (ref 101–111)
Creatinine, Ser: 0.83 mg/dL (ref 0.44–1.00)
GFR calc Af Amer: >60 mL/min (ref >60)
GFR calc non Af Amer: >60 mL/min (ref >60)
Glucose, Bld: 93 mg/dL (ref 65–99)
Potassium: 4.1 mmol/L (ref 3.5–5.1)
Sodium: 143 mmol/L (ref 135–145)

## 2017-12-24 LAB — CBC
HCT: 36 % (ref 36.0–46.0)
Hemoglobin: 11.1 g/dL — ABNORMAL LOW (ref 12.0–15.0)
MCH: 26.9 pg (ref 26.0–34.0)
MCHC: 30.8 g/dL (ref 30.0–36.0)
MCV: 87.2 fL (ref 78.0–100.0)
Platelets: 204 10*3/uL (ref 150–400)
RBC: 4.13 MIL/uL (ref 3.87–5.11)
RDW: 14.5 % (ref 11.5–15.5)
WBC: 10.7 10*3/uL — ABNORMAL HIGH (ref 4.0–10.5)

## 2017-12-24 LAB — ABO/RH: ABO/RH(D): O POS

## 2017-12-24 NOTE — Pre-Procedure Instructions (Signed)
CBC results 12/24/2017 faxed to Dr. Dellis Filbert via epic.

## 2018-01-03 ENCOUNTER — Ambulatory Visit (HOSPITAL_COMMUNITY)
Admission: RE | Admit: 2018-01-03 | Discharge: 2018-01-03 | Disposition: A | Discharge status: Home / Self Care | Payer: BLUE CROSS/BLUE SHIELD | Source: Ambulatory Visit | Attending: Obstetrics & Gynecology | Admitting: Obstetrics & Gynecology

## 2018-01-03 ENCOUNTER — Encounter (HOSPITAL_COMMUNITY): Payer: Self-pay | Admitting: Emergency Medicine

## 2018-01-03 ENCOUNTER — Encounter (HOSPITAL_COMMUNITY)
Admission: RE | Disposition: A | Discharge status: Home / Self Care | Payer: Self-pay | Source: Ambulatory Visit | Attending: Obstetrics & Gynecology

## 2018-01-03 ENCOUNTER — Ambulatory Visit (HOSPITAL_COMMUNITY): Payer: BLUE CROSS/BLUE SHIELD | Admitting: Anesthesiology

## 2018-01-03 DIAGNOSIS — I10 Essential (primary) hypertension: Secondary | ICD-10-CM | POA: Diagnosis not present

## 2018-01-03 DIAGNOSIS — K219 Gastro-esophageal reflux disease without esophagitis: Secondary | ICD-10-CM | POA: Diagnosis not present

## 2018-01-03 DIAGNOSIS — J45909 Unspecified asthma, uncomplicated: Secondary | ICD-10-CM | POA: Diagnosis not present

## 2018-01-03 DIAGNOSIS — K660 Peritoneal adhesions (postprocedural) (postinfection): Secondary | ICD-10-CM | POA: Insufficient documentation

## 2018-01-03 DIAGNOSIS — Z91013 Allergy to seafood: Secondary | ICD-10-CM | POA: Diagnosis not present

## 2018-01-03 DIAGNOSIS — N801 Endometriosis of ovary: Secondary | ICD-10-CM

## 2018-01-03 DIAGNOSIS — D271 Benign neoplasm of left ovary: Secondary | ICD-10-CM

## 2018-01-03 DIAGNOSIS — Z79899 Other long term (current) drug therapy: Secondary | ICD-10-CM | POA: Insufficient documentation

## 2018-01-03 DIAGNOSIS — Z888 Allergy status to other drugs, medicaments and biological substances status: Secondary | ICD-10-CM | POA: Diagnosis not present

## 2018-01-03 DIAGNOSIS — N83202 Unspecified ovarian cyst, left side: Secondary | ICD-10-CM | POA: Diagnosis present

## 2018-01-03 DIAGNOSIS — N7011 Chronic salpingitis: Secondary | ICD-10-CM | POA: Insufficient documentation

## 2018-01-03 HISTORY — PX: ROBOTIC ASSISTED LAPAROSCOPIC LYSIS OF ADHESION: SHX6080

## 2018-01-03 LAB — TYPE AND SCREEN
ABO/RH(D): O POS
Antibody Screen: NEGATIVE

## 2018-01-03 LAB — PREGNANCY, URINE: Preg Test, Ur: NEGATIVE

## 2018-01-03 SURGERY — OOPHORECTOMY, ROBOT-ASSISTED
Anesthesia: General | Laterality: Left

## 2018-01-03 SURGERY — Surgical Case
Anesthesia: *Unknown

## 2018-01-03 MED ORDER — ROCURONIUM BROMIDE 100 MG/10ML IV SOLN
INTRAVENOUS | Status: AC
  Filled 2018-01-03: qty 1

## 2018-01-03 MED ORDER — ACETAMINOPHEN 160 MG/5ML PO SOLN: 960.0000 mg | Freq: Once | ORAL | Status: AC

## 2018-01-03 MED ORDER — PROMETHAZINE HCL 25 MG/ML IJ SOLN: 6.2500 mg | INTRAMUSCULAR | Status: DC | PRN

## 2018-01-03 MED ORDER — LACTATED RINGERS IV SOLN
INTRAVENOUS | Status: DC
  Administered 2018-01-03 (×2): via INTRAVENOUS

## 2018-01-03 MED ORDER — 0.9 % SODIUM CHLORIDE (POUR BTL) OPTIME
TOPICAL | Status: DC | PRN
  Administered 2018-01-03: 1000 mL

## 2018-01-03 MED ORDER — OXYCODONE HCL 5 MG PO TABS
5.0000 mg | ORAL_TABLET | Freq: Once | ORAL | Status: AC | PRN
  Administered 2018-01-03: 5 mg via ORAL

## 2018-01-03 MED ORDER — PROPOFOL 10 MG/ML IV BOLUS
INTRAVENOUS | Status: AC
  Filled 2018-01-03: qty 20

## 2018-01-03 MED ORDER — OXYCODONE-ACETAMINOPHEN 7.5-325 MG PO TABS: 1.0000 | ORAL_TABLET | Freq: Four times a day (QID) | ORAL | 0 refills | Status: DC | PRN

## 2018-01-03 MED ORDER — HYDROMORPHONE HCL 1 MG/ML IJ SOLN
INTRAMUSCULAR | Status: AC
  Administered 2018-01-03: 0.25 mg via INTRAVENOUS
  Filled 2018-01-03: qty 1

## 2018-01-03 MED ORDER — DEXAMETHASONE SODIUM PHOSPHATE 10 MG/ML IJ SOLN
INTRAMUSCULAR | Status: DC | PRN
  Administered 2018-01-03: 10 mg via INTRAVENOUS

## 2018-01-03 MED ORDER — LACTATED RINGERS IV SOLN: INTRAVENOUS | Status: DC

## 2018-01-03 MED ORDER — BUPIVACAINE HCL (PF) 0.25 % IJ SOLN
INTRAMUSCULAR | Status: AC
  Filled 2018-01-03: qty 30

## 2018-01-03 MED ORDER — SUGAMMADEX SODIUM 500 MG/5ML IV SOLN
INTRAVENOUS | Status: AC
  Filled 2018-01-03: qty 5

## 2018-01-03 MED ORDER — OXYCODONE HCL 5 MG PO TABS
ORAL_TABLET | ORAL | Status: AC
  Filled 2018-01-03: qty 1

## 2018-01-03 MED ORDER — OXYCODONE HCL 5 MG/5ML PO SOLN
5.0000 mg | Freq: Once | ORAL | Status: AC | PRN
  Filled 2018-01-03: qty 5

## 2018-01-03 MED ORDER — MIDAZOLAM HCL 2 MG/2ML IJ SOLN
INTRAMUSCULAR | Status: DC | PRN
  Administered 2018-01-03: 2 mg via INTRAVENOUS

## 2018-01-03 MED ORDER — FENTANYL CITRATE (PF) 100 MCG/2ML IJ SOLN
INTRAMUSCULAR | Status: AC
  Filled 2018-01-03: qty 2

## 2018-01-03 MED ORDER — MEPERIDINE HCL 50 MG/ML IJ SOLN
INTRAMUSCULAR | Status: AC
  Filled 2018-01-03: qty 1

## 2018-01-03 MED ORDER — MEPERIDINE HCL 50 MG/ML IJ SOLN: 6.2500 mg | INTRAMUSCULAR | Status: DC | PRN

## 2018-01-03 MED ORDER — LIDOCAINE 2% (20 MG/ML) 5 ML SYRINGE
INTRAMUSCULAR | Status: DC | PRN
  Administered 2018-01-03: 60 mg via INTRAVENOUS

## 2018-01-03 MED ORDER — SUGAMMADEX SODIUM 200 MG/2ML IV SOLN
INTRAVENOUS | Status: DC | PRN
  Administered 2018-01-03: 200 mg via INTRAVENOUS

## 2018-01-03 MED ORDER — ACETAMINOPHEN 500 MG PO TABS
ORAL_TABLET | ORAL | Status: AC
  Filled 2018-01-03: qty 2

## 2018-01-03 MED ORDER — SCOPOLAMINE 1 MG/3DAYS TD PT72
1.0000 | MEDICATED_PATCH | Freq: Once | TRANSDERMAL | Status: AC
  Administered 2018-01-03: 1 via TRANSDERMAL
  Filled 2018-01-03: qty 1

## 2018-01-03 MED ORDER — PROPOFOL 10 MG/ML IV BOLUS
INTRAVENOUS | Status: DC | PRN
  Administered 2018-01-03: 130 mg via INTRAVENOUS

## 2018-01-03 MED ORDER — CELECOXIB 200 MG PO CAPS
200.0000 mg | ORAL_CAPSULE | Freq: Once | ORAL | Status: AC | PRN
  Administered 2018-01-03: 200 mg via ORAL

## 2018-01-03 MED ORDER — BUPIVACAINE HCL (PF) 0.25 % IJ SOLN
INTRAMUSCULAR | Status: DC | PRN
  Administered 2018-01-03: 13 mL

## 2018-01-03 MED ORDER — ACETAMINOPHEN 500 MG PO TABS
1000.0000 mg | ORAL_TABLET | Freq: Once | ORAL | Status: AC
  Administered 2018-01-03: 1000 mg via ORAL

## 2018-01-03 MED ORDER — LIDOCAINE 2% (20 MG/ML) 5 ML SYRINGE
INTRAMUSCULAR | Status: AC
  Filled 2018-01-03: qty 5

## 2018-01-03 MED ORDER — DEXAMETHASONE SODIUM PHOSPHATE 10 MG/ML IJ SOLN
INTRAMUSCULAR | Status: AC
  Filled 2018-01-03: qty 1

## 2018-01-03 MED ORDER — ARTIFICIAL TEARS OPHTHALMIC OINT
TOPICAL_OINTMENT | OPHTHALMIC | Status: AC
  Filled 2018-01-03: qty 3.5

## 2018-01-03 MED ORDER — MIDAZOLAM HCL 2 MG/2ML IJ SOLN
INTRAMUSCULAR | Status: AC
  Filled 2018-01-03: qty 2

## 2018-01-03 MED ORDER — FENTANYL CITRATE (PF) 100 MCG/2ML IJ SOLN
INTRAMUSCULAR | Status: DC | PRN
  Administered 2018-01-03 (×4): 50 ug via INTRAVENOUS

## 2018-01-03 MED ORDER — ONDANSETRON HCL 4 MG/2ML IJ SOLN
INTRAMUSCULAR | Status: AC
  Filled 2018-01-03: qty 2

## 2018-01-03 MED ORDER — HYDROMORPHONE HCL 1 MG/ML IJ SOLN
0.2500 mg | INTRAMUSCULAR | Status: DC | PRN
  Administered 2018-01-03 (×2): 0.25 mg via INTRAVENOUS

## 2018-01-03 MED ORDER — ROCURONIUM BROMIDE 10 MG/ML (PF) SYRINGE
PREFILLED_SYRINGE | INTRAVENOUS | Status: DC | PRN
  Administered 2018-01-03: 20 mg via INTRAVENOUS
  Administered 2018-01-03: 50 mg via INTRAVENOUS

## 2018-01-03 MED ORDER — ONDANSETRON HCL 4 MG/2ML IJ SOLN
INTRAMUSCULAR | Status: DC | PRN
  Administered 2018-01-03: 4 mg via INTRAVENOUS

## 2018-01-03 MED ORDER — CELECOXIB 200 MG PO CAPS
ORAL_CAPSULE | ORAL | Status: AC
  Filled 2018-01-03: qty 1

## 2018-01-03 MED ORDER — SODIUM CHLORIDE 0.9 % IR SOLN
Status: DC | PRN
  Administered 2018-01-03: 1000 mL

## 2018-01-03 MED ORDER — CEFAZOLIN SODIUM-DEXTROSE 2-4 GM/100ML-% IV SOLN
2.0000 g | INTRAVENOUS | Status: AC
  Administered 2018-01-03: 2 g via INTRAVENOUS
  Filled 2018-01-03: qty 100

## 2018-01-03 MED ORDER — KETOROLAC TROMETHAMINE 30 MG/ML IJ SOLN
INTRAMUSCULAR | Status: AC
  Filled 2018-01-03: qty 1

## 2018-01-03 SURGICAL SUPPLY — 58 items
BARRIER ADHS 3X4 INTERCEED (GAUZE/BANDAGES/DRESSINGS) IMPLANT
CANISTER SUCT 3000ML PPV (MISCELLANEOUS) ×4 IMPLANT
CATH FOLEY 3WAY  5CC 16FR (CATHETERS) ×1
CATH FOLEY 3WAY 5CC 16FR (CATHETERS) ×3 IMPLANT
CONT SPEC 4OZ CLIKSEAL STRL BL (MISCELLANEOUS) ×4 IMPLANT
COVER BACK TABLE 60X90IN (DRAPES) ×4 IMPLANT
COVER TIP SHEARS 8 DVNC (MISCELLANEOUS) ×3 IMPLANT
COVER TIP SHEARS 8MM DA VINCI (MISCELLANEOUS) ×1
DECANTER SPIKE VIAL GLASS SM (MISCELLANEOUS) ×8 IMPLANT
DEFOGGER SCOPE WARMER CLEARIFY (MISCELLANEOUS) ×4 IMPLANT
DERMABOND ADVANCED (GAUZE/BANDAGES/DRESSINGS) ×1
DERMABOND ADVANCED .7 DNX12 (GAUZE/BANDAGES/DRESSINGS) ×3 IMPLANT
DRAPE ARM DVNC X/XI (DISPOSABLE) ×12 IMPLANT
DRAPE COLUMN DVNC XI (DISPOSABLE) ×3 IMPLANT
DRAPE DA VINCI XI ARM (DISPOSABLE) ×4
DRAPE DA VINCI XI COLUMN (DISPOSABLE) ×1
DURAPREP 26ML APPLICATOR (WOUND CARE) ×4 IMPLANT
ELECT REM PT RETURN 15FT ADLT (MISCELLANEOUS) ×4 IMPLANT
GLOVE BIO SURGEON STRL SZ 6.5 (GLOVE) ×12 IMPLANT
GLOVE BIOGEL PI IND STRL 7.0 (GLOVE) ×15 IMPLANT
GLOVE BIOGEL PI INDICATOR 7.0 (GLOVE) ×5
IRRIG SUCT STRYKERFLOW 2 WTIP (MISCELLANEOUS) ×4
IRRIGATION SUCT STRKRFLW 2 WTP (MISCELLANEOUS) ×3 IMPLANT
LEGGING LITHOTOMY PAIR STRL (DRAPES) ×4 IMPLANT
OBTURATOR OPTICAL STANDARD 8MM (TROCAR)
OBTURATOR OPTICAL STND 8 DVNC (TROCAR)
OBTURATOR OPTICALSTD 8 DVNC (TROCAR) IMPLANT
OCCLUDER COLPOPNEUMO (BALLOONS) ×4 IMPLANT
PACK ROBOT WH (CUSTOM PROCEDURE TRAY) ×4 IMPLANT
PACK ROBOTIC GOWN (GOWN DISPOSABLE) ×4 IMPLANT
PACK TRENDGUARD 450 HYBRID PRO (MISCELLANEOUS) IMPLANT
PAD PREP 24X48 CUFFED NSTRL (MISCELLANEOUS) ×4 IMPLANT
POSITIONER SURGICAL ARM (MISCELLANEOUS) ×8 IMPLANT
POUCH ENDO CATCH II 15MM (MISCELLANEOUS) IMPLANT
RTRCTR WOUND ALEXIS 18CM SML (INSTRUMENTS)
SAVER CELL AAL HAEMONETICS (INSTRUMENTS) IMPLANT
SEAL CANN UNIV 5-8 DVNC XI (MISCELLANEOUS) ×9 IMPLANT
SEAL XI 5MM-8MM UNIVERSAL (MISCELLANEOUS) ×3
SET CYSTO W/LG BORE CLAMP LF (SET/KITS/TRAYS/PACK) IMPLANT
SET TRI-LUMEN FLTR TB AIRSEAL (TUBING) ×4 IMPLANT
SUT ETHIBOND 0 (SUTURE) IMPLANT
SUT VIC AB 4-0 PS2 27 (SUTURE) ×12 IMPLANT
SUT VICRYL 0 UR6 27IN ABS (SUTURE) ×4 IMPLANT
SUT VLOC 180 0 9IN  GS21 (SUTURE)
SUT VLOC 180 0 9IN GS21 (SUTURE) IMPLANT
SUT VLOC 180 2-0 6IN GS21 (SUTURE) IMPLANT
SYS RETRIEVAL 5MM INZII UNIV (BASKET) ×4
SYSTEM RETRIEVL 5MM INZII UNIV (BASKET) ×3 IMPLANT
TIP RUMI ORANGE 6.7MMX12CM (TIP) IMPLANT
TIP UTERINE 5.1X6CM LAV DISP (MISCELLANEOUS) IMPLANT
TIP UTERINE 6.7X10CM GRN DISP (MISCELLANEOUS) IMPLANT
TIP UTERINE 6.7X6CM WHT DISP (MISCELLANEOUS) IMPLANT
TIP UTERINE 6.7X8CM BLUE DISP (MISCELLANEOUS) ×4 IMPLANT
TOWEL OR 17X26 10 PK STRL BLUE (TOWEL DISPOSABLE) ×8 IMPLANT
TRENDGUARD 450 HYBRID PRO PACK (MISCELLANEOUS)
TROCAR HASSON GELL 12X100 (TROCAR) IMPLANT
TROCAR PORT AIRSEAL 5X120 (TROCAR) ×4 IMPLANT
WATER STERILE IRR 1000ML POUR (IV SOLUTION) ×4 IMPLANT

## 2018-01-03 NOTE — Discharge Instructions (Signed)
Salpingooferectoma unilateral - Cuidados posteriores (Unilateral Salpingo-Oophorectomy, Care After) Siga estas instrucciones durante las prximas semanas. Estas indicaciones le proporcionan informacin general acerca de cmo deber cuidarse despus del procedimiento. El mdico tambin podr darle instrucciones ms especficas. El tratamiento se ha planificado de acuerdo a las prcticas mdicas actuales, pero a veces se producen problemas. Comunquese con el mdico si tiene algn problema o tiene dudas despus del procedimiento. QU ESPERAR DESPUS DEL PROCEDIMIENTO Despus del procedimiento, es tpico tener las siguientes sensaciones:  Dolor abdominal que puede controlarse con medicamentos.  Prdida o hemorragia vaginal.  Estreimiento. INSTRUCCIONES PARA EL CUIDADO EN EL HOGAR   Descanse y duerma lo suficiente.  Tome slo medicamentos de venta libre o recetados, segn las indicaciones del mdico. No tome aspirina. Puede ocasionar hemorragias.  Lazy Y U y secas. Retire o cambie los apsitos (vendajes) tal como le indic su mdico.  Siga las indicaciones de su mdico con respecto a la dieta.  Beba suficiente lquido para Consulting civil engineer orina clara o de color amarillo plido.  [place] indicaciones del mdico. No levante ningn objeto que sea ms pesado que 5 libras (2.3 kg) hasta que el mdico la autorice.  No conduzca vehculos hasta que el mdico la autorice.  No beba alcohol hasta que el mdico la autorice.  No tenga relaciones sexuales hasta que el mdico la autorice.  Tmese la [employer] veces por da y Chartered certified accountant.  Si est constipada podr:  ? Consultar a su mdico si puede tomar un laxante suave. ? Agregar frutas y salvado a su dieta. ? Beber ms lquidos.  Concurra a las consultas de control con su mdico segn las indicaciones. SOLICITE ATENCIN MDICA SI:   La zona de la incisin est roja, se hincha o  Engineer, water.  Le aparece una erupcin cutnea.  Se siente mareada.  Aumenta el dolor y no puede controlarlo con Conservation officer, nature.  Tiene dolor, enrojecimiento o hinchazn en la zona en la que fue colocada la va intravenosa. SOLICITE ATENCIN MDICA DE INMEDIATO SI:  Tiene fiebre.  Presenta un dolor abdominal cada vez ms intenso.  Tiene pus en la zona de la incisin, o la incisin se abre.  Advierte un olor ftido que proviene de la herida o del vendaje.  Tiene una hemorragia vaginal abundante.  Tiene Higher education careers adviser (nuseas).  Siente dolor en el pecho o en las piernas.  Siente dolor al [employer].  Le falta el aire.  Se desmaya. Esta informacin no tiene Marine scientist el consejo del mdico. Asegrese de hacerle al mdico cualquier pregunta que tenga. Document Released: 04/19/2009 Document Revised: 04/12/2013 Elsevier Interactive Patient Education  2017 [employer].

## 2018-01-03 NOTE — Discharge Summary (Signed)
Physician Discharge Summary  Patient ID: [name] MRN: [id number] DOB/AGE: 10-Sep-1970 47 y.o.  Admit date: 01/03/2018 Discharge date: 01/03/2018  Admission Diagnoses: large left ovarian cyst with septations   Discharge Diagnoses: Same and Omental adhesions Active Problems:   * No active hospital problems. *   Discharged Condition: good  Consults:None  Significant Diagnostic Studies: None  Treatments: Robotic Left Oophorectomy, bilateral Salpingectomy, Lysis of Adhesions, peritoneal washings  Vitals:   01/03/18 0532  BP: (!) 159/96  Pulse: 74  Resp: 16  Temp: 98 F (36.7 C)  SpO2: 100%     Total I/O In: 750 [I.V.:750] Out: 15 [Blood:15]  Discharge Exam: Normal postop  Disposition: Discharge home    Allergies as of 01/03/2018      Reactions   Fish Allergy Nausea And Vomiting      Medication List    TAKE these medications   acetaminophen 325 MG tablet Commonly known as:  TYLENOL Take 325-650 mg by mouth every 6 (six) hours as needed for moderate pain or headache.   albuterol 108 (90 Base) MCG/ACT inhaler Commonly known as:  PROVENTIL HFA;VENTOLIN HFA Inhale 2-3 puffs into the lungs every 6 (six) hours as needed for wheezing or shortness of breath.   IZTIWPYKD-98 EX Apply 1 application topically 2 (two) times daily as needed (for itchy skin (poison oak)).   hydrOXYzine 25 MG tablet Commonly known as:  ATARAX/VISTARIL Take 25 mg by mouth 3 (three) times daily as needed for itching.   ibuprofen 200 MG tablet Commonly known as:  ADVIL,MOTRIN Take 200-400 mg by mouth every 6 (six) hours as needed for headache or moderate pain.   lisinopril 10 MG tablet Commonly known as:  PRINIVIL,ZESTRIL Take 10 mg by mouth daily.   omeprazole 10 MG capsule Commonly known as:  PRILOSEC Take 10 mg by mouth daily.   oxyCODONE-acetaminophen 7.5-325 MG tablet Commonly known as:  PERCOCET Take 1 tablet by mouth every 6 (six) hours as needed for severe pain.    triamcinolone 0.025 % cream Commonly known as:  KENALOG Apply 1 application topically 2 (two) times daily.   ZYRTEC ALLERGY 10 MG tablet Generic drug:  cetirizine Take 10 mg by mouth daily as needed for allergies.        Follow-up Information    Princess Bruins, MD Follow up in 3 week(s).   Specialty:  Obstetrics and Gynecology Contact information: [address] [place] [address] [phone]            Signed: Princess Bruins 01/03/2018, 10:18 AM

## 2018-01-03 NOTE — Transfer of Care (Signed)
Immediate Anesthesia Transfer of Care Note  Patient: [name]  Procedure(s) Performed: XI ROBOTIC ASSISTED OOPHORECTOMY (Left ) XI ROBOTIC ASSISTED SALPINGECTOMY WITH PERITONEAL WASHINGS (Bilateral ) XI ROBOTIC ASSISTED LAPAROSCOPIC LYSIS OF ADHESIONS BETWEEN OMENTUM AND ABDOMINAL WALL  Patient Location: PACU  Anesthesia Type:General  Level of Consciousness: awake, alert , oriented and patient cooperative  Airway & Oxygen Therapy: Patient Spontanous Breathing and Patient connected to face mask oxygen  Post-op Assessment: Report given to RN and Post -op Vital signs reviewed and stable  Post vital signs: Reviewed and stable  Last Vitals:  Vitals Value Taken Time  BP    Temp    Pulse    Resp    SpO2      Last Pain:  Vitals:   01/03/18 0555  TempSrc:   PainSc: 4       Patients Stated Pain Goal: 4 (59/45/85 9292)  Complications: No apparent anesthesia complications

## 2018-01-03 NOTE — Anesthesia Procedure Notes (Signed)
Procedure Name: Intubation Date/Time: 01/03/2018 7:51 AM Performed by: Wanita Chamberlain, CRNA Pre-anesthesia Checklist: Timeout performed, Patient being monitored, Suction available, Emergency Drugs available and Patient identified Patient Re-evaluated:Patient Re-evaluated prior to induction Oxygen Delivery Method: Circle system utilized Preoxygenation: Pre-oxygenation with 100% oxygen Induction Type: IV induction Ventilation: Mask ventilation without difficulty Laryngoscope Size: Mac and 3 Grade View: Grade III Tube type: Oral Tube size: 7.0 mm Number of attempts: 1 Airway Equipment and Method: Stylet and Bite block Placement Confirmation: CO2 detector,  breath sounds checked- equal and bilateral,  positive ETCO2 and ETT inserted through vocal cords under direct vision Secured at: 19 cm Dental Injury: Teeth and Oropharynx as per pre-operative assessment  Difficulty Due To: Difficult Airway- due to limited oral opening

## 2018-01-03 NOTE — Anesthesia Preprocedure Evaluation (Addendum)
Anesthesia Evaluation  Patient identified by MRN, date of birth, ID band Patient awake    Reviewed: Allergy & Precautions, NPO status , Patient's Chart, lab work & pertinent test results  Airway Mallampati: III  TM Distance: >3 FB Neck ROM: Full    Dental no notable dental hx. (+) Teeth Intact, Dental Advisory Given, Caps,    Pulmonary asthma ,    Pulmonary exam normal breath sounds clear to auscultation       Cardiovascular hypertension, Pt. on medications Normal cardiovascular exam Rhythm:Regular Rate:Normal  ECG: SB, rate 59   Neuro/Psych  Headaches, negative psych ROS   GI/Hepatic Neg liver ROS, GERD  Medicated and Controlled,  Endo/Other  negative endocrine ROS  Renal/GU negative Renal ROS     Musculoskeletal negative musculoskeletal ROS (+)   Abdominal (+) + obese,   Peds  Hematology negative hematology ROS (+)   Anesthesia Other Findings large left ovarian cyst with septations  Reproductive/Obstetrics hcg negative                           Anesthesia Physical Anesthesia Plan  ASA: II  Anesthesia Plan: General   Post-op Pain Management:    Induction: Intravenous  PONV Risk Score and Plan: 4 or greater and Scopolamine patch - Pre-op, Midazolam, Dexamethasone, Ondansetron and Treatment may vary due to age or medical condition  Airway Management Planned: Oral ETT  Additional Equipment:   Intra-op Plan:   Post-operative Plan: Extubation in OR  Informed Consent: I have reviewed the patients History and Physical, chart, labs and discussed the procedure including the risks, benefits and alternatives for the proposed anesthesia with the patient or authorized representative who has indicated his/her understanding and acceptance.   Dental advisory given  Plan Discussed with: CRNA and Anesthesiologist  Anesthesia Plan Comments:        Anesthesia Quick Evaluation

## 2018-01-03 NOTE — H&P (Signed)
[name] is an 47 y.o. female.[id number] Married  RP: Left Ovarian Cyst for Robotic Left Oophorectomy, Bilateral Salpingectomy, Peritoneal Washings  HPI: Patient was last seen on September 15, 2017 and did not schedule her surgery until early July 2019 because she was traveling and out of the country.  Complains of generalized abdominal discomfort with loss of appetite and occasional vomiting.  Normal regular periods with last menstrual period December 04, 2017.  No abnormal vaginal discharge.  No fever.  Urine and bowel movements normal.   Pertinent Gynecological History: Menses: flow is moderate  Contraception:  S/P TL Blood transfusions: none Sexually transmitted diseases: no past history Last mammogram: normal  Last pap: normal  OB History: G4, P3, A1, L3   Menstrual History: Patient's last menstrual period was 12/04/2017 (exact date).    Past Medical History:  Diagnosis Date  . Adnexal mass    Left  . Asthma    only in Spring- allergic to pollens  . Complication of anesthesia    had a lot of bleeding and blood clots from vagina after surgery in [place]. [place] over 15 years ago, had to be readmitted to hospital, no blood transfusion was necessary  . GERD (gastroesophageal reflux disease)   . Headache    occ  . Hypertension    Not taking any medication at this time, Was taking Lisinopril 10 mg but has not had any for 1 month ran out of prescription, didnt have a PCP until 12/2017  . Poison ivy 12/2017  . SUI (stress urinary incontinence, female)   . Vitamin D deficiency     Past Surgical History:  Procedure Laterality Date  . CESAREAN SECTION    . LAPAROSCOPIC TUBAL LIGATION Bilateral 08/22/2012   Procedure: LAPAROSCOPIC TUBAL LIGATION;  Surgeon: Woodroe Mode, MD;  Location: [facility];  Service: Gynecology;  Laterality: Bilateral;  IUD removal  . LIPOSUCTION TRUNK  2010  . OTHER SURGICAL HISTORY     Had a surgery in [place]. [place] unknown name,  . REDUCTION  MAMMAPLASTY  2010    Family History  Problem Relation Age of Onset  . Heart disease Father   . Diabetes Sister   . Hypertension Sister   . Hypertension Brother   . Liver disease Mother   . Breast cancer Maternal Aunt     Social History:  reports that she has never smoked. She has never used smokeless tobacco. She reports that she does not drink alcohol or use drugs.  Allergies:  Allergies  Allergen Reactions  . Fish Allergy Nausea And Vomiting    Medications Prior to Admission  Medication Sig Dispense Refill Last Dose  . acetaminophen (TYLENOL) 325 MG tablet Take 325-650 mg by mouth every 6 (six) hours as needed for moderate pain or headache.   Past Month at Unknown time  . cetirizine (ZYRTEC ALLERGY) 10 MG tablet Take 10 mg by mouth daily as needed for allergies.    Past Month at Unknown time  . Hydrocortisone (CHYIFOYDX-41 EX) Apply 1 application topically 2 (two) times daily as needed (for itchy skin (poison oak)).   Past Month at Unknown time  . hydrOXYzine (ATARAX/VISTARIL) 25 MG tablet Take 25 mg by mouth 3 (three) times daily as needed for itching.  0 Past Week at Unknown time  . ibuprofen (ADVIL,MOTRIN) 200 MG tablet Take 200-400 mg by mouth every 6 (six) hours as needed for headache or moderate pain.    Past Month at Unknown time  . lisinopril (PRINIVIL,ZESTRIL)  10 MG tablet Take 10 mg by mouth daily.   12/04/2017  . omeprazole (PRILOSEC) 10 MG capsule Take 10 mg by mouth daily.   01/02/2018 at Unknown time  . triamcinolone (KENALOG) 0.025 % cream Apply 1 application topically 2 (two) times daily.   Past Month at Unknown time  . albuterol (PROVENTIL HFA;VENTOLIN HFA) 108 (90 Base) MCG/ACT inhaler Inhale 2-3 puffs into the lungs every 6 (six) hours as needed for wheezing or shortness of breath.   More than a month at Unknown time    REVIEW OF SYSTEMS: A ROS was performed and pertinent positives and negatives are included in the history.  GENERAL: No fevers or chills. HEENT: No  change in vision, no earache, sore throat or sinus congestion. NECK: No pain or stiffness. CARDIOVASCULAR: No chest pain or pressure. No palpitations. PULMONARY: No shortness of breath, cough or wheeze. GASTROINTESTINAL: No abdominal pain, nausea, vomiting or diarrhea, melena or bright red blood per rectum. GENITOURINARY: No urinary frequency, urgency, hesitancy or dysuria. MUSCULOSKELETAL: No joint or muscle pain, no back pain, no recent trauma. DERMATOLOGIC: No rash, no itching, no lesions. ENDOCRINE: No polyuria, polydipsia, no heat or cold intolerance. No recent change in weight. HEMATOLOGICAL: No anemia or easy bruising or bleeding. NEUROLOGIC: No headache, seizures, numbness, tingling or weakness. PSYCHIATRIC: No depression, no loss of interest in normal activity or change in sleep pattern.     Blood pressure (!) 159/96, pulse 74, temperature 98 F (36.7 C), temperature source Oral, resp. rate 16, height 5\' 1"  (1.549 m), weight 164 lb (74.4 kg), last menstrual period 12/04/2017, SpO2 100 %.  Physical Exam:  See office notes   Results for orders placed or performed during the hospital encounter of 01/03/18 (from the past 24 hour(s))  Pregnancy, urine     Status: None   Collection Time: 01/03/18  5:38 AM  Result Value Ref Range   Preg Test, Ur NEGATIVE NEGATIVE   Pelvic US 09/15/2017: T/V and T/A images.  The uterus is anteverted and homogeneous.  It measures 11.02 x 6.39 x 5.93 cm.  The uterus is displaced due to a left adnexal mass.  Right ovary normal.  Left ovary with a rim of tissue seen and arterial and venous flow present.  The large left ovarian cyst measures 12.0 x 11.0 x 10.4 cm.  The mass is primarily cystic with negative color flow Doppler but thick septations are present with a small focal solid area measuring 7 mm.  No free fluid in the posterior cul-de-sac.  Ca 125 normal and stable at 16 on 09/15/2017.   Assessment/Plan:  47 y.o. [id number]   1. Left ovarian cyst Large  left ovarian cyst measuring 12 cm negative color flow Doppler but septations present with a small solid area measuring 7 mm.  No free fluid in the posterior cul-de-sac.  Ca125 normal.  Decision to proceed with X I robotic left salpingo-oophorectomy and right salpingectomy with peritoneal washings.  Surgery, risks and benefits reviewed with patient. Risks of trauma, infection, hemorrhage, anesthesia complication, DVT/pulmonary embolism reviewed with patient.  The small risk that the final pathology would show an ovarian cancer discussed.  Patient understands that in that case she would be referred to a gynecologic oncologist for further management and treatment.  2. Generalized abdominal pain Generalized abdominal discomfort probably of gastrointestinal origin given that the patient occasionally feels nauseated and has had vomiting.  Will schedule an appointment promptly with her family physician to investigate.  Patient was counseled as to the risk of surgery to include the following:  1. Infection (prohylactic antibiotics will be administered)  2. DVT/Pulmonary Embolism (prophylactic pneumo compression stockings will be used)  3.Trauma to internal organs requiring additional surgical procedure to repair any injury to internal organs requiring perhaps additional hospitalization days.  4.Hemmorhage requiring transfusion and blood products which carry risks such as anaphylactic reaction, hepatitis and AIDS  Patient had received literature information on the procedure scheduled and all her questions were answered and fully accepts all risk.   [name] 01/03/2018, 7:32 AM

## 2018-01-03 NOTE — Op Note (Signed)
Operative Note  01/03/2018  10:21 AM  PATIENT:  [name]  46 y.o. female  PRE-OPERATIVE DIAGNOSIS:  Large left ovarian cyst with septations  POST-OPERATIVE DIAGNOSIS:  Large left ovarian cyst with septations, omental adhesions  PROCEDURE:  Procedure(s): XI ROBOTIC ASSISTED LEFT OOPHORECTOMY XI ROBOTIC ASSISTED BILATERAL SALPINGECTOMY WITH PERITONEAL WASHINGS XI ROBOTIC ASSISTED LAPAROSCOPIC LYSIS OF ADHESIONS BETWEEN OMENTUM AND ABDOMINAL WALL  SURGEON:  Surgeon(s): Princess Bruins, MD Fontaine, Belinda Block, MD  ANESTHESIA:   general  FINDINGS: Large left Ovarian Cyst >12 cm with adhesions.  S/P Bilateral Tubal Sterilization with Filshie clips, Omental adhesions to the anterior abdominal wall.  DESCRIPTION OF OPERATION: Under general anesthesia with endotracheal intubation, the patient is in lithotomy position.  She is prepped with DuraPrep on the abdomen and with Betadine on the suprapubic, vulvar and vaginal areas.  She is draped as usual.  Timeout is done.  Vulva is normal.  The bimanual exam reveals an anteverted uterus, normal volume.  The left adnexa presents a large soft mass and the right adnexa is normal.  The weighted speculum is inserted in the vagina and the anterior lip of the cervix is grasped with a tenaculum.  Hysterometry is at 9 cm.  We used a Rumi #8 with the small Ko-ring which are put in place easily.  The other instruments are removed.  The Foley is put in place in the bladder.  We then go to the abdomen.      The supraumbilical area is infiltrated with Marcaine one quarter plain.  We make a 1.5 cm incision with a scalpel at that level.  The aponeurosis is grasped with Coker's.  The aponeurosis is opened under direct vision with Mayo scissors.  The parietal peritoneum is grasped with hemostats and opened with Mayo scissors under direct vision.  We put a pursestring stitch of Vicryl 0 at the aponeurosis.  The [facility] port is inserted under direct vision and  pneumoperitoneum is created with CO2.  We marked the skin for port insertions.  Marcaine is infiltrated as all sites.  We make small incisions with a scalpel at those sites.  2 robotic ports are inserted under direct vision on the left.  One robotic port is inserted under direct vision on the right lateral site.  The 5 mm assistant port is inserted under direct vision at the right medial site.  We start by laparoscopy for peritoneal washings using the Nezhat.  Pictures are taken of the uterus, right ovary and left ovarian cyst.  Pictures are taken of the liver.  The patient is placed in 28 degree Trendelenburg.  The X I robot is positioned at the right patient's side.  The camera port is docked.  Targeting is done.  The other ports are docked as well.  The robotic instruments are inserted under direct vision with the PK in the first arm, the EndoShears scissor in the third arm and the fenestrated bipolar in the fourth arm.  I go to the console.    The omental adhesions with the anterior wall are released.  The right ureter is seen in normal anatomic position with good peristalsis.  On the left the large ovarian cyst prevents visualization of the ureter.  We start mobilizing the left ovary and during that process the cyst is ruptured.  The clear serous fluid evacuates from the cyst.  The surface of the left ovary was completely smooth with with no increase in vascularization.  The left ovary is very adherent to  the left ovarian fossa as well as to the sigmoid colon.  Adhesions are released carefully.  The left ureter is seen in normal anatomic position with good peristalsis.  The left infundibulopelvic ligament is freed of adhesions.  We safely cauterized and sectioned it.  We cauterized and sectioned just below the left ovary and tube.  We then cauterized and sectioned the left tube at the cornua.  We cauterized and sectioned the left utero-ovarian ligament.  The left ovary with the large cyst and the left tube  were completely freed and put in the posterior cul-de-sac.  On the right side we cauterized and section the left mesosalpinx.  We then cauterized and sectioned the right tube at the cornua to completely free the right tube.  Hemostasis is adequate at all levels.  Robotic instruments are removed under direct vision.  The robot is undocked.  We go to laparoscopy time.      The Endobag is inserted in the abdominal pelvic cavities.  Both specimens are entered in the bag.  And removed from the cavity.  The specimens are sent to pathology.  The abdominal pelvic cavities are irrigated and suctioned.  Hemostasis is confirmed once more.  Laparoscopic instruments are removed under direct vision.  The ports are removed under direct vision.  The CO2 is evacuated.  The supraumbilical incision is closed by attaching the pursestring stitch at the aponeurosis.  All incisions are closed with separate stitches of Vicryl 4-0.  Dermabond is added on all incisions.  The vaginal instruments are removed.  The patient is brought to recovery room in good and stable status.   ESTIMATED BLOOD LOSS:15 mL   Intake/Output Summary (Last 24 hours) at 01/03/2018 1021 Last data filed at 01/03/2018 1018    Gross per 24 hour  Intake 1000 ml  Output 615 ml  Net 385 ml    BLOOD ADMINISTERED:none  LOCAL MEDICATIONS USED: MARCAINE     SPECIMEN: Source of Specimen: Left ovary, bilateral tubes.  Peritoneal washings.  DISPOSITION OF SPECIMEN: PATHOLOGY  COUNTS: YES  PLAN OF CARE: Transfer to PACU  Marie-Lyne LavoieMD10:21 AM

## 2018-01-04 ENCOUNTER — Encounter (HOSPITAL_COMMUNITY): Payer: Self-pay | Admitting: Obstetrics & Gynecology

## 2018-01-04 NOTE — Anesthesia Postprocedure Evaluation (Signed)
Anesthesia Post Note  Patient: [name]  Procedure(s) Performed: XI ROBOTIC ASSISTED OOPHORECTOMY (Left ) XI ROBOTIC ASSISTED SALPINGECTOMY WITH PERITONEAL WASHINGS (Bilateral ) XI ROBOTIC ASSISTED LAPAROSCOPIC LYSIS OF ADHESIONS BETWEEN OMENTUM AND ABDOMINAL WALL     Patient location during evaluation: PACU Anesthesia Type: General Level of consciousness: awake and alert Pain management: pain level controlled Vital Signs Assessment: post-procedure vital signs reviewed and stable Respiratory status: spontaneous breathing, nonlabored ventilation, respiratory function stable and patient connected to nasal cannula oxygen Cardiovascular status: blood pressure returned to baseline and stable Postop Assessment: no apparent nausea or vomiting Anesthetic complications: no    Last Vitals:  Vitals:   01/03/18 1206 01/03/18 1315  BP: (!) 155/96 137/89  Pulse: 68 71  Resp: 16 14  Temp:  36.6 C  SpO2: 97% 98%    Last Pain:  Vitals:   01/03/18 1300  TempSrc:   PainSc: 6                  Taliesin Hartlage P Eddie Payette

## 2018-01-09 NOTE — Op Note (Signed)
Operative Note  01/03/2018  10:21 AM  PATIENT:  [name]  47 y.o. female  PRE-OPERATIVE DIAGNOSIS:  Large left ovarian cyst with septations  POST-OPERATIVE DIAGNOSIS:  Large left ovarian cyst with septations, omental adhesions  PROCEDURE:  Procedure(s): XI ROBOTIC ASSISTED LEFT OOPHORECTOMY XI ROBOTIC ASSISTED BILATERAL SALPINGECTOMY WITH PERITONEAL WASHINGS XI ROBOTIC ASSISTED LAPAROSCOPIC LYSIS OF ADHESIONS BETWEEN OMENTUM AND ABDOMINAL WALL  SURGEON:  Surgeon(s): Princess Bruins, MD Fontaine, Belinda Block, MD  ANESTHESIA:   general  FINDINGS: Large left Ovarian Cyst >12 cm with adhesions.  S/P Bilateral Tubal Sterilization with Filshie clips, Omental adhesions to the anterior abdominal wall.  DESCRIPTION OF OPERATION: Under general anesthesia with endotracheal intubation, the patient is in lithotomy position.  She is prepped with DuraPrep on the abdomen and with Betadine on the suprapubic, vulvar and vaginal areas.  She is draped as usual.  Timeout is done.  Vulva is normal.  The bimanual exam reveals an anteverted uterus, normal volume.  The left adnexa presents a large soft mass and the right adnexa is normal.  The weighted speculum is inserted in the vagina and the anterior lip of the cervix is grasped with a tenaculum.  Hysterometry is at 9 cm.  We used a Rumi #8 with the small Ko-ring which are put in place easily.  The other instruments are removed.  The Foley is put in place in the bladder.  We then go to the abdomen.      The supraumbilical area is infiltrated with Marcaine one quarter plain.  We make a 1.5 cm incision with a scalpel at that level.  The aponeurosis is grasped with Coker's.  The aponeurosis is opened under direct vision with Mayo scissors.  The parietal peritoneum is grasped with hemostats and opened with Mayo scissors under direct vision.  We put a pursestring stitch of Vicryl 0 at the aponeurosis.  The [facility] port is inserted under direct  vision and pneumoperitoneum is created with CO2.  We marked the skin for port insertions.  Marcaine is infiltrated as all sites.  We make small incisions with a scalpel at those sites.  2 robotic ports are inserted under direct vision on the left.  One robotic port is inserted under direct vision on the right lateral site.  The 5 mm assistant port is inserted under direct vision at the right medial site.  We start by laparoscopy for peritoneal washings using the Nezhat.  Pictures are taken of the uterus, right ovary and left ovarian cyst.  Pictures are taken of the liver.  The patient is placed in 28 degree Trendelenburg.  The X I robot is positioned at the right patient's side.  The camera port is docked.  Targeting is done.  The other ports are docked as well.  The robotic instruments are inserted under direct vision with the PK in the first arm, the EndoShears scissor in the third arm and the fenestrated bipolar in the fourth arm.  I go to the console.      The omental adhesions with the anterior wall are released.  The right ureter is seen in normal anatomic position with good peristalsis.  On the left the large ovarian cyst prevents visualization of the ureter.  We start mobilizing the left ovary and during that process the cyst is ruptured.  The clear serous fluid evacuates from the cyst.  The surface of the left ovary was completely smooth with with no increase in vascularization.  The left ovary is very  adherent to the left ovarian fossa as well as to the sigmoid colon.  Adhesions are released carefully.  The left ureter is seen in normal anatomic position with good peristalsis.  The left infundibulopelvic ligament is freed of adhesions.  We safely cauterized and sectioned it.  We cauterized and sectioned just below the left ovary and tube.  We then cauterized and sectioned the left tube at the cornua.  We cauterized and sectioned the left utero-ovarian ligament.  The left ovary with the large cyst and the  left tube were completely freed and put in the posterior cul-de-sac.  On the right side we cauterized and section the left mesosalpinx.  We then cauterized and sectioned the right tube at the cornua to completely free the right tube.  Hemostasis is adequate at all levels.  Robotic instruments are removed under direct vision.  The robot is undocked.  We go to laparoscopy time.      The Endobag is inserted in the abdominal pelvic cavities.  Both specimens are entered in the bag.  And removed from the cavity.  The specimens are sent to pathology.  The abdominal pelvic cavities are irrigated and suctioned.  Hemostasis is confirmed once more.  Laparoscopic instruments are removed under direct vision.  The ports are removed under direct vision.  The CO2 is evacuated.  The supraumbilical incision is closed by attaching the pursestring stitch at the aponeurosis.  All incisions are closed with separate stitches of Vicryl 4-0.  Dermabond is added on all incisions.  The vaginal instruments are removed.  The patient is brought to recovery room in good and stable status.             ESTIMATED BLOOD LOSS: 15 mL   Intake/Output Summary (Last 24 hours) at 01/03/2018 1021 Last data filed at 01/03/2018 1018    Gross per 24 hour  Intake 1000 ml  Output 615 ml  Net 385 ml     BLOOD ADMINISTERED:none   LOCAL MEDICATIONS USED:  MARCAINE     SPECIMEN:  Source of Specimen:  Left ovary, bilateral tubes.  Peritoneal washings.  DISPOSITION OF SPECIMEN:  PATHOLOGY  COUNTS:  YES  PLAN OF CARE: Transfer to PACU  Marie-Lyne LavoieMD10:21 AM

## 2018-01-10 ENCOUNTER — Encounter: Payer: Self-pay | Admitting: Gastroenterology

## 2018-01-26 ENCOUNTER — Encounter: Payer: Self-pay | Admitting: Obstetrics & Gynecology

## 2018-01-26 ENCOUNTER — Ambulatory Visit (INDEPENDENT_AMBULATORY_CARE_PROVIDER_SITE_OTHER): Payer: BLUE CROSS/BLUE SHIELD | Admitting: Obstetrics & Gynecology

## 2018-01-26 VITALS — BP 150/90

## 2018-01-26 DIAGNOSIS — Z09 Encounter for follow-up examination after completed treatment for conditions other than malignant neoplasm: Secondary | ICD-10-CM

## 2018-01-26 NOTE — Progress Notes (Signed)
    [name] 1971-02-16 [id number]        47 y.o.  [id number]   RP: Post op Robotic left oophorectomy with bilateral salpingectomy and extensive lysis of adhesions on January 03, 2018  HPI: Slow improvement, using mainly ibuprofen for pain at this time.  No vaginal bleeding currently.  Urine normal.  Eating well with no vomiting.  Normal bowel movements.  No fever.   OB History  Gravida Para Term Preterm AB Living  4 3 3  0 1 3  SAB TAB Ectopic Multiple Live Births  0 1 0 0      # Outcome Date GA Lbr Len/2nd Weight Sex Delivery Anes PTL Lv  4 TAB           3 Term           2 Term           1 Term             Past medical history,surgical history, problem list, medications, allergies, family history and social history were all reviewed and documented in the EPIC chart.   Directed ROS with pertinent positives and negatives documented in the history of present illness/assessment and plan.  Exam:  Vitals:   01/26/18 1023  BP: (!) 150/90   General appearance:  Normal  Abdomen: Normal.  Incisions healing well, no erythema, no drainage  Gynecologic exam: Vulva normal.  Bimanual exam: Uterus anteverted, normal volume, mobile, nontender.  No adnexal mass felt, nontender bilaterally.  Patho 01/03/2018: Diagnosis Fallopian tube, bilateral, and left ovary RIGHT FALLOPIAN TUBE: - FINDINGS CONSISTENT WITH PREVIOUS TUBAL LIGATION. - NO EVIDENCE OF MALIGNANCY. LEFT FALLOPIAN TUBE: - HYDROSALPINX. - FINDINGS CONSISTENT WITH PREVIOUS TUBAL LIGATION. - NO EVIDENCE OF MALIGNANCY. LEFT OVARY: - BENIGN SEROUS CYSTADENOMA. - ENDOMETRIOSIS. - NO EVIDENCE OF MALIGNANCY.   Assessment/Plan:  47 y.o. [id number]   1. Status post gynecological surgery, follow-up exam Slower recovery probably due to the extensive lysis of adhesions. Doing better and better with no evidence of complication.  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  Return to work at  6 weeks postop.  Princess Bruins MD, 10:53 AM 01/26/2018

## 2018-01-30 ENCOUNTER — Encounter: Payer: Self-pay | Admitting: Obstetrics & Gynecology

## 2018-01-30 NOTE — Patient Instructions (Signed)
1. Status post gynecological surgery, follow-up exam Slower recovery probably due to the extensive lysis of adhesions. Doing better and better with no evidence of complication.  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  Return to work at 6 weeks postop.  Jenetta Downer, fue un placer verle hoy!

## 2018-02-04 ENCOUNTER — Encounter: Payer: Self-pay | Admitting: Family Medicine

## 2018-02-04 ENCOUNTER — Ambulatory Visit (INDEPENDENT_AMBULATORY_CARE_PROVIDER_SITE_OTHER): Payer: BLUE CROSS/BLUE SHIELD | Admitting: Family Medicine

## 2018-02-04 ENCOUNTER — Other Ambulatory Visit: Payer: Self-pay

## 2018-02-04 VITALS — BP 147/92 | HR 73 | Temp 98.5°F | Ht 62.0 in | Wt 161.0 lb

## 2018-02-04 DIAGNOSIS — Z90710 Acquired absence of both cervix and uterus: Secondary | ICD-10-CM

## 2018-02-04 DIAGNOSIS — Z0000 Encounter for general adult medical examination without abnormal findings: Secondary | ICD-10-CM | POA: Diagnosis not present

## 2018-02-04 DIAGNOSIS — Z8632 Personal history of gestational diabetes: Secondary | ICD-10-CM

## 2018-02-04 DIAGNOSIS — I10 Essential (primary) hypertension: Secondary | ICD-10-CM

## 2018-02-04 DIAGNOSIS — Z119 Encounter for screening for infectious and parasitic diseases, unspecified: Secondary | ICD-10-CM | POA: Diagnosis not present

## 2018-02-04 MED ORDER — LISINOPRIL 10 MG PO TABS: 10.0000 mg | ORAL_TABLET | Freq: Every day | ORAL | 2 refills | Status: DC

## 2018-02-04 NOTE — Progress Notes (Signed)
8/2/20198:50 AM  [name] Jun 18, 1971, 47 y.o. female [id number]  Chief Complaint  Patient presents with  . Annual Exam    HPI:   Patient is a 47 y.o. female with past medical history significant for HTN who presents today for CPE  Cervical Cancer Screening: n/a hyst for DUB, benign Breast Cancer Screening: 09/2017 Colorectal Cancer Screening: n/a Bone Density Testing: n/a HIV Screening: done today Seasonal Influenza Vaccination: declines Td/Tdap Vaccination: declines today as she has had one within last 10 years when she cut herself Pneumococcal Vaccination: n/a Zoster Vaccination: n/a Frequency of Dental evaluation: Q6 months, [employer] in [place] evaluation: wears glasses, [facility]  Has not taken BP meds for over a month  Fall Risk  02/04/2018 12/21/2017  Falls in the past year? No No     Depression screen [facility] 2/9 02/04/2018 12/21/2017  Decreased Interest 0 0  Down, Depressed, Hopeless 0 0  PHQ - 2 Score 0 0    Allergies  Allergen Reactions  . Fish Allergy Nausea And Vomiting    Prior to Admission medications   Medication Sig Start Date End Date Taking? Authorizing Provider  acetaminophen (TYLENOL) 325 MG tablet Take 325-650 mg by mouth every 6 (six) hours as needed for moderate pain or headache.    [provider]  albuterol (PROVENTIL HFA;VENTOLIN HFA) 108 (90 Base) MCG/ACT inhaler Inhale 2-3 puffs into the lungs every 6 (six) hours as needed for wheezing or shortness of breath.    [provider]  cetirizine (ZYRTEC ALLERGY) 10 MG tablet Take 10 mg by mouth daily as needed for allergies.     [provider]  Hydrocortisone ([id number] EX) Apply 1 application topically 2 (two) times daily as needed (for itchy skin (poison oak)).    [provider]  hydrOXYzine (ATARAX/VISTARIL) 25 MG tablet Take 25 mg by mouth 3 (three) times daily as needed for itching.  12/13/17   [provider]  ibuprofen (ADVIL,MOTRIN) 200 MG tablet Take 200-400 mg by mouth every 6 (six) hours as needed for headache or moderate pain.     [provider]  lisinopril (PRINIVIL,ZESTRIL) 10 MG tablet Take 10 mg by mouth daily.    [provider]  omeprazole (PRILOSEC) 10 MG capsule Take 10 mg by mouth daily.    [provider]  triamcinolone (KENALOG) 0.025 % cream Apply 1 application topically 2 (two) times daily.    [provider]    Past Medical History:  Diagnosis Date  . Adnexal mass    Left  . Asthma    only in Spring- allergic to pollens  . Complication of anesthesia    had a lot of bleeding and blood clots from vagina after surgery in [place]. [place] over 15 years ago, had to be readmitted to hospital, no blood transfusion was necessary  . GERD (gastroesophageal reflux disease)   . Headache    occ  . Hypertension    Not taking any medication at this time, Was taking Lisinopril 10 mg but has not had any for 1 month ran out of prescription, didnt have a PCP until 12/2017  . Poison ivy 12/2017  . SUI (stress urinary incontinence, female)   . Vitamin D deficiency     Past Surgical History:  Procedure Laterality Date  . CESAREAN SECTION    . LAPAROSCOPIC TUBAL LIGATION Bilateral 08/22/2012   Procedure: LAPAROSCOPIC TUBAL LIGATION;  Surgeon: Woodroe Mode, MD;  Location: [facility];  Service: Gynecology;  Laterality: Bilateral;  IUD removal  . LIPOSUCTION TRUNK  2010  . OTHER SURGICAL HISTORY     Had a surgery in [place]. [place] unknown name,  . REDUCTION MAMMAPLASTY  2010  . ROBOTIC ASSISTED LAPAROSCOPIC LYSIS OF ADHESION  01/03/2018   Procedure: XI ROBOTIC ASSISTED LAPAROSCOPIC LYSIS OF ADHESIONS BETWEEN OMENTUM AND ABDOMINAL WALL;  Surgeon: Princess Bruins, MD;  Location: [facility];  Service: Gynecology;;    Social History   Tobacco Use  . Smoking status: Never Smoker  . Smokeless tobacco: Never Used  Substance Use  Topics  . Alcohol use: No    Family History  Problem Relation Age of Onset  . Heart disease Father   . Diabetes Sister   . Hypertension Sister   . Hypertension Brother   . Liver disease Mother   . Breast cancer Maternal Aunt     Review of Systems  Constitutional: Negative for chills and fever.  Respiratory: Negative for cough and shortness of breath.   Cardiovascular: Positive for palpitations. Negative for chest pain and leg swelling.  Gastrointestinal: Negative for abdominal pain, nausea and vomiting.  Genitourinary: Negative for dysuria and hematuria.  Musculoskeletal: Positive for back pain.  Neurological: Positive for headaches. Negative for dizziness, tingling, speech change and focal weakness.  Psychiatric/Behavioral: Negative for depression. The patient is not nervous/anxious and does not have insomnia.   All other systems reviewed and are negative.    OBJECTIVE:  Blood pressure (!) 147/92, pulse 73, temperature 98.5 F (36.9 C), temperature source Oral, height 5' 2" (1.575 m), weight 161 lb (73 kg), last menstrual period 01/04/2018, SpO2 97 %. Body mass index is 29.45 kg/m.    Visual Acuity Screening   Right eye Left eye Both eyes  Without correction:     With correction: 20/25 20/25 20/25    BP Readings from Last 3 Encounters:  02/04/18 (!) 147/92  01/26/18 (!) 150/90  01/03/18 137/89   Physical Exam  Constitutional: She is oriented to person, place, and time. She appears well-developed and well-nourished.  HENT:  Head: Normocephalic and atraumatic.  Right Ear: Hearing, tympanic membrane, external ear and ear canal normal.  Left Ear: Hearing, tympanic membrane, external ear and ear canal normal.  Mouth/Throat: Oropharynx is clear and moist.  Eyes: Pupils are equal, round, and reactive to light. Conjunctivae and EOM are normal.  Neck: Neck supple. No thyromegaly present.  Cardiovascular: Normal rate, regular rhythm, normal heart sounds and intact distal  pulses. Exam reveals no gallop and no friction rub.  No murmur heard. Pulmonary/Chest: Effort normal and breath sounds normal. She has no wheezes. She has no rales.  Abdominal: Soft. Bowel sounds are normal. She exhibits no distension and no mass. There is no tenderness.  Musculoskeletal: Normal range of motion. She exhibits no edema.  Lymphadenopathy:    She has no cervical adenopathy.  Neurological: She is alert and oriented to person, place, and time. She has normal reflexes. No cranial nerve deficit. Gait normal.  Skin: Skin is warm and dry.  Psychiatric: She has a normal mood and affect.  Nursing note and vitals reviewed.   ASSESSMENT and PLAN  1. Annual physical exam  Routine HCM labs ordered. HCM reviewed/discussed. Anticipatory guidance regarding healthy weight, lifestyle and choices given.   - CMP14+EGFR  2. Essential hypertension, benign Uncontrolled. Restarting meds, adjust as needed. - Lipid panel - CMP14+EGFR - TSH  3. H/O gestational diabetes mellitus, not currently pregnant - Hemoglobin A1c  4. Screening examination for infectious disease - HIV antibody  5. S/P hysterectomy  Other orders - lisinopril (PRINIVIL,ZESTRIL) 10 MG tablet; Take 1 tablet (10 mg total) by mouth daily.  Return for 1 month with me for BP, next available with PA for skin tag remova;.    Rutherford Guys, MD [facility] at [place], [place] [address] Ph.  [phone] Fax [phone]

## 2018-02-04 NOTE — Patient Instructions (Addendum)
IF you received an x-ray today, you will receive an invoice from [facility]. Please contact [facility] at [phone] with questions or concerns regarding your invoice.   IF you received labwork today, you will receive an invoice from [facility]. Please contact [facility] at [phone] with questions or concerns regarding your invoice.   Our billing staff will not be able to assist you with questions regarding bills from these companies.  You will be contacted with the lab results as soon as they are available. The fastest way to get your results is to activate your My Chart account. Instructions are located on the last page of this paperwork. If you have not heard from [place] regarding the results in 2 weeks, please contact this office.     Cuidados preventivos en las mujeres de 68 a 54 aos de edad Preventive Care 40-64 Years, Female Los cuidados preventivos hacen referencia a las opciones en cuanto al estilo de vida y a las visitas al mdico, las cuales pueden promover la salud y Musician. Qu incluyen los cuidados preventivos?  Un examen fsico anual. Esto tambin se conoce como control de bienestar anual.  Exmenes dentales [place] al ao.  Exmenes de la vista de rutina. Pregntele al mdico con qu frecuencia debe realizarse un control de la vista.  Opciones personales de estilo de vida, que incluyen lo siguiente: ? [employer] y las encas a diario. ? Realizar actividad fsica con regularidad. ? Tener una dieta saludable. ? Evitar el consumo de tabaco y drogas. ? Limitar el consumo de bebidas alcohlicas. ? Counsellor. ? Tomar una dosis baja de aspirina diariamente a partir de los 67 aos de [place]. ? Tomar los suplementos de vitaminas o minerales como se lo haya indicado el mdico. Qu sucede durante un control de bienestar anual? Los servicios y exmenes de deteccin realizados por su mdico durante el control de  bienestar anual dependern de su salud general, factores de riesgo de estilo de vida y los antecedentes familiares de enfermedades. Asesoramiento Su mdico puede preguntarle acerca de:  Consumo de alcohol.  Consumo de tabaco.  Consumo de drogas.  Bienestar emocional.  Bienestar en el hogar y las relaciones personales.  Actividad sexual.  Hbitos de alimentacin.  Trabajo y [place] laboral.  Mtodos anticonceptivos.  El ciclo menstrual.  Antecedentes de embarazo.  Pruebas de deteccin Pueden hacerle las siguientes pruebas o mediciones:  Estatura, peso e ndice de masa muscular [facility]).  Presin arterial.  Niveles de lpidos y colesterol. Estos se pueden verificar cada 5 aos o, con ms frecuencia, si usted tiene ms de 63 aos de edad.  Control de la piel.  Pruebas de deteccin de cncer de pulmn. Es posible que se le realice esta prueba de deteccin a partir de los 55 aos de edad, si ha fumado durante 30 aos un paquete diario y sigue fumando o dej el hbito en algn momento en los ltimos 15 aos.  Prueba de Personnel officer en las heces [facility]). Es posible que se le realice esta prueba todos los aos a partir de los 32 aos de [place].  Sigmoidoscopa o colonoscopa flexible. Es posible que se le realice una sigmoidoscopa cada 5 aos o una colonoscopa cada 10 aos a partir de los 48 aos de [place].  Anlisis de sangre para la deteccin de la hepatitis C.  Anlisis de sangre para la deteccin de la hepatitis B.  Anlisis de enfermedades de transmisin sexual (ETS).  Pruebas de deteccin de la diabetes. Esto se Set designer un control del azcar en la sangre (glucosa) despus de no haber comido durante un periodo de tiempo (ayuno). Es posible que se le realice esta prueba cada 1 a 3 tres aos.  Mamografa. Se puede realizar cada 1 o 2 aos. Hable con su mdico sobre cundo debe comenzar a Engineer, manufacturing de [place] regular. Esto depende de si tiene antecedentes  familiares de cncer de mama o no.  Pruebas de deteccin de cncer relacionado con las mutaciones del BRCA. Es posible que se los deba realizar si tiene antecedentes de cncer de mama, de ovario, de trompas o peritoneal.  Examen plvico y prueba de Papanicolaou. Esto se puede realizar cada 70aos a Renato Gails de los 21aos de edad. A partir de los 30 aos, esto se puede Optometrist cada 5 aos si usted se realiza una prueba de Papanicolaou en combinacin con una prueba de deteccin del virus del papiloma humano (VPH).  Densitometra sea. Esto se realiza para detectar osteoporosis. Se le puede realizar este examen de deteccin si tiene un riesgo alto de tener osteoporosis.  Hable con su mdico para [employer], las opciones de tratamiento y, si corresponde, la necesidad de Optometrist ms pruebas. Vacunas El mdico puede recomendarle que se aplique algunas vacunas, por ejemplo:  Vacuna contra la gripe. Se recomienda aplicarse esta vacuna todos los aos.  Vacuna contra la difteria, ttanos y tos Dietitian (DTPa, DT). Es posible que tenga que aplicarse un refuerzo contra el ttanos y la difteria (DT) cada 10aos.  Vacuna contra la varicela. Es posible que tenga que aplicrsela si no recibi esta vacuna.  Vacuna contra el herpes zster. Es posible que la necesite despus de los 70 aos de edad.  Vacuna contra el sarampin, rubola y paperas (SRP). Es posible que necesite aplicarse al menos una dosis de la vacuna SRP si naci despus de [phone]. Podra tambin necesitar una segunda dosis.  Vacuna antineumoccica conjugada 13 valente (PCV13). Puede necesitar esta vacuna si tiene determinadas enfermedades y no se vacun anteriormente.  Vacuna antineumoccica de polisacridos (PPSV23). Quizs tenga que aplicarse una o dos dosis si fuma o si sufre determinadas enfermedades.  Vacuna antimeningoccica. Puede necesitar esta vacuna si tiene determinadas afecciones.  Vacuna contra la hepatitis  A. Es posible que necesite esta vacuna si tiene ciertas afecciones o si viaja o trabaja en lugares en los que podra estar expuesto a la hepatitis A.  Vacuna contra la hepatitis B. Es posible que necesite esta vacuna si tiene ciertas afecciones o si viaja o trabaja en lugares en los que podra estar expuesto a la hepatitis B.  Vacuna contra antihaemophilus influenzae tipoB (Hib). Puede necesitar esta vacuna si tiene determinadas afecciones.  Hable con el mdico sobre qu pruebas de deteccin y qu vacunas necesita, y con qu frecuencia las necesita. Esta informacin no tiene Marine scientist el consejo del mdico. Asegrese de hacerle al mdico cualquier pregunta que tenga. Document Released: 11/03/2016 Document Revised: 11/03/2016 Document Reviewed: 04/23/2015 Elsevier Interactive Patient Education  2018 [employer].

## 2018-02-05 LAB — HEMOGLOBIN A1C
Est. average glucose Bld gHb Est-mCnc: 117 mg/dL
Hgb A1c MFr Bld: 5.7 % — ABNORMAL HIGH (ref 4.8–5.6)

## 2018-02-05 LAB — CMP14+EGFR
ALT: 57 IU/L — ABNORMAL HIGH (ref 0–32)
AST: 40 IU/L (ref 0–40)
Albumin/Globulin Ratio: 1.3 (ref 1.2–2.2)
Albumin: 4.3 g/dL (ref 3.5–5.5)
Alkaline Phosphatase: 103 IU/L (ref 39–117)
BUN/Creatinine Ratio: 21 (ref 9–23)
BUN: 16 mg/dL (ref 6–24)
Bilirubin Total: 0.3 mg/dL (ref 0.0–1.2)
CO2: 24 mmol/L (ref 20–29)
Calcium: 9.3 mg/dL (ref 8.7–10.2)
Chloride: 101 mmol/L (ref 96–106)
Creatinine, Ser: 0.77 mg/dL (ref 0.57–1.00)
GFR calc Af Amer: 106 mL/min/{1.73_m2} (ref >59)
GFR calc non Af Amer: 92 mL/min/{1.73_m2} (ref >59)
Globulin, Total: 3.2 g/dL (ref 1.5–4.5)
Glucose: 93 mg/dL (ref 65–99)
Potassium: 4.2 mmol/L (ref 3.5–5.2)
Sodium: 138 mmol/L (ref 134–144)
Total Protein: 7.5 g/dL (ref 6.0–8.5)

## 2018-02-05 LAB — LIPID PANEL
Chol/HDL Ratio: 5.8 ratio — ABNORMAL HIGH (ref 0.0–4.4)
Cholesterol, Total: 213 mg/dL — ABNORMAL HIGH (ref 100–199)
HDL: 37 mg/dL — ABNORMAL LOW (ref >39)
LDL Calculated: 132 mg/dL — ABNORMAL HIGH (ref 0–99)
Triglycerides: 220 mg/dL — ABNORMAL HIGH (ref 0–149)
VLDL Cholesterol Cal: 44 mg/dL — ABNORMAL HIGH (ref 5–40)

## 2018-02-05 LAB — HIV ANTIBODY (ROUTINE TESTING W REFLEX): HIV Screen 4th Generation wRfx: NONREACTIVE

## 2018-02-05 LAB — TSH: TSH: 1.92 u[IU]/mL (ref 0.450–4.500)

## 2018-02-14 ENCOUNTER — Encounter: Payer: Self-pay | Admitting: Gastroenterology

## 2018-02-14 ENCOUNTER — Ambulatory Visit (INDEPENDENT_AMBULATORY_CARE_PROVIDER_SITE_OTHER): Payer: BLUE CROSS/BLUE SHIELD | Admitting: Gastroenterology

## 2018-02-14 VITALS — BP 138/84 | HR 76 | Ht 62.0 in | Wt 164.0 lb

## 2018-02-14 DIAGNOSIS — R1310 Dysphagia, unspecified: Secondary | ICD-10-CM | POA: Diagnosis not present

## 2018-02-14 DIAGNOSIS — R140 Abdominal distension (gaseous): Secondary | ICD-10-CM | POA: Diagnosis not present

## 2018-02-14 DIAGNOSIS — K219 Gastro-esophageal reflux disease without esophagitis: Secondary | ICD-10-CM

## 2018-02-14 DIAGNOSIS — R1319 Other dysphagia: Secondary | ICD-10-CM

## 2018-02-14 NOTE — Progress Notes (Signed)
Chief Complaint: Bloating, reflux, dysphagia   Referring Provider:     Rutherford Guys, MD    HPI:   Patient is a 47 yo pleasant female referred to the GI Clinic for evaluation of multiple GI complaints, to include bloating, reflux, and dysphagia.  She states that she has had abdominal bloating and post prandial upper abdominal discomfort for approximately 2 years. Sxs were intermittent, and now daily. Sxs occur after nearly every meal. Sxs do not occur with fasting or "simple foods". She does endorse visible distension, which is limited to her upper abdomen, which she feels is in part due to a history of tummy tuck in 2010 limiting the distendibility of the lower abdomen. Additionally, she feels a "hardness" at upper abdomen, without tenderness. This has been present for months. No prior EGD. No prior colonoscopy. Family history notable for Mother with cirrhosis of unknown etiology, otherwise no FHx of IBD or GI malignancy.   Additionally, she c/o intermittent, worsening reflux sxs, most notably HB and regurgitation. Sxs also worsened with spicy foods, which she tends to avoid. Currently takes Zegerid (omepreazole/sodium bicarb), taken as needed which resolves reflux sxs. Takes prn approx 2 times/week. Never daily acid suppression meds. Occasional dysphagia to solids, pointing to suprasternal notch. No prior food impactions. Ongoing intermittently x2 years or so, without any certain foods exacerbating the dysphagia. No associated odynophagia. No weight loss, early satiety, or GI blood loss. She has weight gain of approx 10 lbs over last few months. She did have a left ovarian cyst s/p removal July 2019. Sxs predate that surgery.   NKDA   Past Medical History:  Diagnosis Date  . Adnexal mass    Left  . Asthma    only in Spring- allergic to pollens  . Complication of anesthesia    had a lot of bleeding and blood clots from vagina after surgery in [place]. [place] over 15 years  ago, had to be readmitted to hospital, no blood transfusion was necessary  . GERD (gastroesophageal reflux disease)   . Headache    occ  . Hypertension    Not taking any medication at this time, Was taking Lisinopril 10 mg but has not had any for 1 month ran out of prescription, didnt have a PCP until 12/2017  . Poison ivy 12/2017  . SUI (stress urinary incontinence, female)   . Vitamin D deficiency      Past Surgical History:  Procedure Laterality Date  . CESAREAN SECTION    . LAPAROSCOPIC TUBAL LIGATION Bilateral 08/22/2012   Procedure: LAPAROSCOPIC TUBAL LIGATION;  Surgeon: Woodroe Mode, MD;  Location: [facility];  Service: Gynecology;  Laterality: Bilateral;  IUD removal  . LIPOSUCTION TRUNK  2010  . OTHER SURGICAL HISTORY     Had a surgery in [place]. [place] unknown name,  . REDUCTION MAMMAPLASTY  2010  . ROBOTIC ASSISTED LAPAROSCOPIC LYSIS OF ADHESION  01/03/2018   Procedure: XI ROBOTIC ASSISTED LAPAROSCOPIC LYSIS OF ADHESIONS BETWEEN OMENTUM AND ABDOMINAL WALL;  Surgeon: Princess Bruins, MD;  Location: [facility];  Service: Gynecology;;   Family History  Problem Relation Age of Onset  . Heart disease Father   . Diabetes Sister   . Hypertension Sister   . Hypertension Brother   . Liver disease Mother   . Breast cancer Maternal Aunt    Social History   Tobacco Use  . Smoking status: Never Smoker  . Smokeless  tobacco: Never Used  Substance Use Topics  . Alcohol use: No  . Drug use: No   Current Outpatient Medications  Medication Sig Dispense Refill  . albuterol (PROVENTIL HFA;VENTOLIN HFA) 108 (90 Base) MCG/ACT inhaler Inhale 2-3 puffs into the lungs every 6 (six) hours as needed for wheezing or shortness of breath.    . cetirizine (ZYRTEC ALLERGY) 10 MG tablet Take 10 mg by mouth daily as needed for allergies.     Marland Kitchen ibuprofen (ADVIL,MOTRIN) 200 MG tablet Take 200 mg by mouth every 6 (six) hours as needed.    Marland Kitchen lisinopril (PRINIVIL,ZESTRIL) 10 MG tablet Take 1 tablet (10 mg  total) by mouth daily. 30 tablet 2  . Omeprazole-Sodium Bicarbonate (ZEGERID) 20-1100 MG CAPS capsule Take 1 capsule by mouth daily before breakfast. As needed     No current facility-administered medications for this visit.    Allergies  Allergen Reactions  . Fish Allergy Nausea And Vomiting     Review of Systems: All systems reviewed and negative except where noted in HPI.   Serum creatinine: 0.77 mg/dL 02/04/18 1005 Estimated creatinine clearance: 81.2 mL/min   Physical Exam:    Wt Readings from Last 3 Encounters:  02/14/18 164 lb (74.4 kg)  02/04/18 161 lb (73 kg)  01/03/18 164 lb (74.4 kg)    BP 138/84   Pulse 76   Ht 5\' 2"  (1.575 m)   Wt 164 lb (74.4 kg)   SpO2 98%   BMI 30.00 kg/m  Constitutional:  Pleasant female in no acute distress. Psychiatric: Normal mood and affect. Behavior is normal. EENT: Pupils normal.  Conjunctivae are normal. No scleral icterus. Neck supple.  Cardiovascular: Normal rate, regular rhythm. No edema Pulmonary/chest: Effort normal and breath sounds normal. No wheezing, rales or rhonchi. Abdominal: Soft, nondistended, nontender. Bowel sounds active throughout. There are no masses palpable. No hepatomegaly. Well healed surgical incision sites without e/o infection.  Neurological: Alert and oriented to person place and time. Skin: Skin is warm and dry. No rashes noted.   ASSESSMENT AND PLAN;   47 yo female presenting with abdominal bloating, reflux symptoms, and dysphagia.   1) Reflux: Worsening reflux symptoms could be related to LES compromise, particularly if related to a new/worsenign hiatal hernia, which may or may not be related to increased intraabdominal pressure (ie, prior tummy tuck, heavy lifting at work, etc).   - Resume acid suppression therapy - EGD to eval for erosive esophagitis, hiatal hernia as well as mucosal etiology for dysphagia as below - If symptoms not improved, will either empirically change to  alternate/regular PPI or refer for EM/pH/MII  - Educated on lifestyle modifications for reflux control, to include: EtOH and tobacco avoidance, avoid known triggers, carbonated beverages, chocolate, and caffeine. Patient should consume smaller more frequent meals; maintain a healthy BMI, exercise 3-4 times/week, avoid eating 3 hours prior to bedtime and to maintain the [facility] at a 30-45 degree elevation.  - Follow-up in 2-3 months for review of medication efficacy, or sooner prn   2) Dysphagia: Solid food dysphagia. Will eval with EGD for diagnostic and potentially therapeutic intent (ie. Dilation) - Chew foods thoroughly and with plenty of fluids  3) Abdominal bloating: Discussed the ddx for abdominal bloating and distension at length. Given association with worsening reflux symptoms and dysphagia, will elect to evaluate for intraluminal or mucosal etiology with EGD as above.   The indications, risks, and benefits of EGD were explained to the patient in detail. Risks include but are not  limited to bleeding, perforation, adverse reaction to medications, and cardiopulmonary compromise. Sequelae include but are not limited to the possibility of surgery, hositalization, and mortality. The patient verbalized understanding and wished to proceed. All questions answered, referred to scheduler. Further recommendations pending results of the exam.    Lavena Bullion, DO, FACG  02/14/2018, 1:50 PM   Rutherford Guys, MD

## 2018-02-14 NOTE — Patient Instructions (Signed)
If you are age 49 or older, your body mass index should be between 23-30. Your Body mass index is 30 kg/m. If this is out of the aforementioned range listed, please consider follow up with your Primary Care Provider.  If you are age 34 or younger, your body mass index should be between 19-25. Your Body mass index is 30 kg/m. If this is out of the aformentioned range listed, please consider follow up with your Primary Care Provider.   You have been scheduled for an endoscopy. Please follow written instructions given to you at your visit today. If you use inhalers (even only as needed), please bring them with you on the day of your procedure. Your physician has requested that you go to www.startemmi.com and enter the access code given to you at your visit today. This web site gives a general overview about your procedure. However, you should still follow specific instructions given to you by our office regarding your preparation for the procedure.  Thank you, Dr. Bryan Lemma

## 2018-02-25 ENCOUNTER — Ambulatory Visit (INDEPENDENT_AMBULATORY_CARE_PROVIDER_SITE_OTHER): Payer: BLUE CROSS/BLUE SHIELD | Admitting: Physician Assistant

## 2018-02-25 ENCOUNTER — Encounter: Payer: Self-pay | Admitting: Physician Assistant

## 2018-02-25 ENCOUNTER — Other Ambulatory Visit: Payer: Self-pay

## 2018-02-25 VITALS — BP 120/72 | HR 78 | Temp 98.3°F | Resp 16 | Ht 62.0 in | Wt 163.0 lb

## 2018-02-25 DIAGNOSIS — L918 Other hypertrophic disorders of the skin: Secondary | ICD-10-CM

## 2018-02-25 NOTE — Patient Instructions (Addendum)
  Skin tag lesions are unlikely to recur after removal, but new lesions develop in predisposed skin areas.  Come back if you would like any more skin tags removed.   Skin Tag, Adult A skin tag (acrochordon) is a soft, extra growth of skin. Most skin tags are flesh-colored and rarely bigger than a pencil eraser. They commonly form near areas where there are folds in the skin, such as the armpit or groin. Skin tags are not dangerous, and they do not spread from person to person (are not contagious). You may have one skin tag or several. Skin tags do not require treatment. However, your health care provider may recommend removal of a skin tag if it:  Gets irritated from clothing.  Bleeds.  Is visible and unsightly.  Your health care provider can remove skin tags with a simple surgical procedure or a procedure that involves freezing the skin tag. Follow these instructions at home:  Watch for any changes in your skin tag. A normal skin tag does not require any other special care at home.  Take over-the-counter and prescription medicines only as told by your health care provider.  Keep all follow-up visits as told by your health care provider. This is important. Contact a health care provider if:  You have a skin tag that: ? Becomes painful. ? Changes color. ? Bleeds. ? Swells.  You develop more skin tags. This information is not intended to replace advice given to you by your health care provider. Make sure you discuss any questions you have with your health care provider. Document Released: 07/07/2015 Document Revised: 02/16/2016 Document Reviewed: 07/07/2015 Elsevier Interactive Patient Education  2018 [employer].     IF you received an x-ray today, you will receive an invoice from [facility]. Please contact [facility] at [phone] with questions or concerns regarding your invoice.   IF you received labwork today, you will receive an invoice from  [place]. Please contact [facility] at [phone] with questions or concerns regarding your invoice.   Our billing staff will not be able to assist you with questions regarding bills from these companies.  You will be contacted with the lab results as soon as they are available. The fastest way to get your results is to activate your My Chart account. Instructions are located on the last page of this paperwork. If you have not heard from [place] regarding the results in 2 weeks, please contact this office.

## 2018-02-25 NOTE — Progress Notes (Signed)
   [name]  MRN: [id number] DOB: 11/27/1970  PCP: Rutherford Guys, MD  Subjective:  Pt is a 47 year old female who presents to clinic for removal of skin tags. She has a few on the right side of her neck and two beneath her left breast. "They are ugly".  She has never had skin tag removal before.  Review of Systems  Musculoskeletal: Negative.   Skin: Negative.     Patient Active Problem List   Diagnosis Date Noted  . H/O gestational diabetes mellitus, not currently pregnant 02/04/2018  . S/P hysterectomy 02/04/2018  . Pelvic mass in female 05/21/2016  . Arthralgia 05/08/2016  . Intractable cluster headache syndrome 05/08/2016  . Menopausal syndrome (hot flashes) 05/08/2016    Current Outpatient Medications on File Prior to Visit  Medication Sig Dispense Refill  . albuterol (PROVENTIL HFA;VENTOLIN HFA) 108 (90 Base) MCG/ACT inhaler Inhale 2-3 puffs into the lungs every 6 (six) hours as needed for wheezing or shortness of breath.    . cetirizine (ZYRTEC ALLERGY) 10 MG tablet Take 10 mg by mouth daily as needed for allergies.     Marland Kitchen ibuprofen (ADVIL,MOTRIN) 200 MG tablet Take 200 mg by mouth every 6 (six) hours as needed.    Marland Kitchen lisinopril (PRINIVIL,ZESTRIL) 10 MG tablet Take 1 tablet (10 mg total) by mouth daily. 30 tablet 2  . Omeprazole-Sodium Bicarbonate (ZEGERID) 20-1100 MG CAPS capsule Take 1 capsule by mouth daily before breakfast. As needed     No current facility-administered medications on file prior to visit.     Allergies  Allergen Reactions  . Fish Allergy Nausea And Vomiting     Objective:  BP 120/72 (BP Location: Left Arm, Patient Position: Sitting, Cuff Size: Normal)   Pulse 78   Temp 98.3 F (36.8 C) (Oral)   Resp 16   Ht 5\' 2"  (1.575 m)   Wt 163 lb (73.9 kg)   LMP 01/04/2018 Comment: per pt had surgery on July 1, 19 for a cyst on left ovary, haven't had a menstrual cycle   SpO2 98%   BMI 29.81 kg/m   Physical Exam  Constitutional: She is  oriented to person, place, and time. No distress.  Neurological: She is alert and oriented to person, place, and time.  Skin: Skin is warm and dry.     Psychiatric: Judgment normal.  Vitals reviewed.  Procedure: Verbal consent obtained. Skin tags are snipped off using alcohol for cleansing and sterile scissors. Local anesthesia was used. These pathognomonic lesions are not sent for pathology.  Assessment and Plan :  1. Skin tag - Pt c/o skin tags of her right neck and left upper torso. Lesions are benign appearing. Skin tags removed with scissors without complication and were not sent for pathology. Bleeding controlled with bandaid. RTC PRN.    Mercer Pod, PA-C  [facility] 02/25/2018 9:23 AM  Please note: Portions of this report may have been transcribed using dragon voice recognition software. Every effort was made to ensure accuracy; however, inadvertent computerized transcription errors may be present.

## 2018-03-04 ENCOUNTER — Ambulatory Visit: Payer: BLUE CROSS/BLUE SHIELD | Admitting: Family Medicine

## 2018-03-08 ENCOUNTER — Encounter: Payer: Self-pay | Admitting: Family Medicine

## 2018-03-08 ENCOUNTER — Other Ambulatory Visit: Payer: Self-pay

## 2018-03-08 ENCOUNTER — Ambulatory Visit (INDEPENDENT_AMBULATORY_CARE_PROVIDER_SITE_OTHER): Payer: BLUE CROSS/BLUE SHIELD | Admitting: Family Medicine

## 2018-03-08 VITALS — BP 133/83 | HR 74 | Temp 98.1°F | Ht 62.21 in | Wt 164.6 lb

## 2018-03-08 DIAGNOSIS — R7303 Prediabetes: Secondary | ICD-10-CM

## 2018-03-08 DIAGNOSIS — Z6829 Body mass index (BMI) 29.0-29.9, adult: Secondary | ICD-10-CM

## 2018-03-08 DIAGNOSIS — I10 Essential (primary) hypertension: Secondary | ICD-10-CM

## 2018-03-08 MED ORDER — LISINOPRIL 10 MG PO TABS
10.0000 mg | ORAL_TABLET | Freq: Every day | ORAL | 3 refills | Status: DC
Start: 2018-03-08 — End: 2018-08-24

## 2018-03-08 NOTE — Patient Instructions (Addendum)
If you have lab work done today you will be contacted with your lab results within the next 2 weeks.  If you have not heard from [place] then please contact us. The fastest way to get your results is to register for My Chart.   IF you received an x-ray today, you will receive an invoice from [facility]. Please contact [facility] at [phone] with questions or concerns regarding your invoice.   IF you received labwork today, you will receive an invoice from [place]. Please contact [facility] at [phone] with questions or concerns regarding your invoice.   Our billing staff will not be able to assist you with questions regarding bills from these companies.  You will be contacted with the lab results as soon as they are available. The fastest way to get your results is to activate your My Chart account. Instructions are located on the last page of this paperwork. If you have not heard from [place] regarding the results in 2 weeks, please contact this office.     Recuento de caloras para bajar de peso Calorie Counting for [employer] Loss Las caloras son unidades de Teacher, early years/pre. Su cuerpo necesita una cierta cantidad de caloras de los alimentos para que le ayuden a Company secretary. Cuando come ms caloras de las que el cuerpo necesita, este acumula las caloras extra [place]. Cuando come [facility] de las que el cuerpo [place], este quema grasa para obtener la energa que requiere. El recuento de caloras es el registro de la cantidad de caloras que come y Pharmacologist. El recuento de caloras puede ser de ayuda si necesita perder peso. Si se asegura de comer menos caloras de las que el cuerpo necesita, debe bajar de [place]. Pregntele al mdico cul es un peso sano para usted. Para que el recuento de caloras funcione, tendr que comer la cantidad de caloras adecuadas para usted en un da, para bajar una cantidad de peso saludable por semana. Un nutricionista puede determinar  la cantidad de caloras que necesita por da y sugerirle cmo alcanzar su objetivo calrico.  Una cantidad de peso saludable para bajar por semana suele ser [place] y Ivar Drape (0,5 a 0,9kg). Esto significa con frecuencia que su ingesta diaria de caloras se debera reducir unas 500 a 750caloras.  Ingerir 1200 a 1500caloras por Administrator, Civil Service a la [place] a Administrator, Civil Service.  Ingerir de 1500 a 1800caloras por Administrator, Civil Service a la [employer] de los hombres a Administrator, Civil Service.  En qu consiste el plan? Mi objetivo es comer __________ Raenette Rover da. Si como esta cantidad de caloras por da, debo bajar unas __________ Terrall Laity. Qu debo saber acerca del recuento de caloras? A fin de alcanzar su objetivo diario de caloras, tendr que:  Averiguar cuntas caloras hay en cada alimento que le Therapist, occupational. Intente hacerlo antes de comer.  Decida la cantidad que puede comer del alimento.  Anote lo que comi y cuntas caloras tena. Esta tarea se conoce como llevar un registro de comidas.  Para perder peso con xito es importante equilibrar el recuento de caloras con un estilo de vida saludable que incluya actividad fsica de forma regular. Tenga un objetivo de 161minutos de ejercicio moderado (como caminar) o 75 minutos de ejercicio vigoroso (como correr) todas las semanas. Dnde encuentro informacin sobre las caloras?  Es posible Animator cantidad de caloras que contiene un alimento en la etiqueta de informacin nutricional. Si un  alimento no tiene una etiqueta de informacin nutricional, intente buscar las caloras en Internet o pida ayuda al nutricionista. Recuerde que las caloras se calculan por porcin. Si opta por comer ms de una porcin de un alimento, tendr [employer] las caloras por porcin por la cantidad de porciones que planea comer. Por ejemplo, la etiqueta de un envase de pan puede decir que el tamao de una porcin es [place], y que una  porcin tiene 90caloras. Si come 1rodaja, habr comido 90caloras. Si come 2rodajas, habr comido 180caloras. Cmo llevo un registro de comidas? Despus de cada comida, registre la siguiente informacin en el registro de comidas:  Lo que comi. No olvide incluir los aderezos, las salsas y otros extras de la comida.  La cantidad que comi. Esto se puede medir en tazas, onzas o cantidad de alimentos.  Cuntas caloras ingiri por comida y por bebida.  La cantidad total de caloras en la comida.  Tenga a Materials engineer de comidas, por ejemplo, en un anotador de bolsillo o utilice una aplicacin mvil o sitio web. Algunos programas calcularn las caloras y Automotive engineer la cantidad de caloras que le quedan para llegar al objetivo diario. Cules son algunos consejos para el recuento de caloras?  Use las caloras de los alimentos y las bebidas que lo sacien y no lo dejen con apetito: ? Algunos ejemplos de alimentos que lo sacian son los frutos secos y Engineer, mining de frutos secos, verduras, Advertising account planner y Clinical research associate con alto contenido de Pharmacist, hospital como los cereales integrales. Los alimentos con alto contenido de [place] son aquellos que tienen ms de 5g de fibra por porcin. ? Las [employer] refrescos, especialmente las bebidas a base de caf y los jugos, que contienen muchas caloras, pero no le dan saciedad.  Coma alimentos nutritivos y evite las caloras vacas. Las caloras vacas son aquellas que se obtienen de los alimentos o las bebidas que no contienen muchos nutrientes ni protenas, como los dulces y los refrescos. Es mejor comer una comida nutritiva altamente calrica (como un aguacate) que una con pocos nutrientes (como una bolsa de patatas fritas).  Sepa cuntas caloras tienen los alimentos que come con ms frecuencia. Esto le ayudar a contar las caloras ms rpidamente.  Preste atencin a las [employer]. Las bebidas de bajas caloras incluyen agua y  refrescos sin Location manager.  Preste atencin a las etiquetas nutricionales de alimentos "bajos en grasas" o "sin grasas". Estos alimentos a veces tienen la misma cantidad de caloras o ms caloras que las versiones ricas en grasa. Con frecuencia, tambin tienen agregados de azcar, almidn o sal, para darles el sabor que fue eliminado con la grasa.  Encuentre un mtodo para controlar las caloras que funcione para usted. Sea creativo. Pruebe aplicaciones o programas distintos, si llevar un registro de las caloras no funciona para usted. Cules son algunos consejos para controlar las porciones?  Sepa cuntas caloras hay en una porcin. Esto lo ayudar a saber cuntas porciones de un alimento determinado puede comer.  Use una taza medidora para medir los tamaos de las porciones. Tambin Secondary school teacher las porciones en una balanza de cocina. Con el tiempo, podr hacer un clculo estimativo de los tamaos de las porciones de algunos alimentos.  Dedique tiempo a poner porciones de diferentes alimentos en sus platos, tazones y tazas predilectos, a fin de saber cmo se ve una porcin.  Intente no comer directamente de una bolsa o una caja. Esto puede llevarlo a comer  en exceso. Ponga la cantidad [employer] gustara comer en una taza o un plato, a fin de asegurarse de que est comiendo la porcin correcta.  Use platos, vasos y tazones ms pequeos para no comer en exceso.  Intente no realizar varias tareas al [employer] (como mirar la TV o usar su computadora) [place] come. Si es la hora de comer, sintese a Conservation officer, nature y disfrute de Environmental education officer. Esto lo ayudar a Marine scientist cundo est satisfecho. Tambin le ayudar a tomar conciencia de lo que est comiendo y de la cantidad. Cules son algunos consejos para seguir este plan? Lectura de las etiquetas de los alimentos  Controle el recuento de caloras en comparacin con el tamao de la porcin. El tamao de la porcin puede ser ms pequeo de lo que suele  comer.  Verifique la fuente de las caloras. Asegrese de que la comida que ingiere tenga alto contenido de vitaminas y protenas y sea baja en grasas saturadas y grasas trans. De compras  Lea las etiquetas nutricionales cuando compre. Esto le ayudar a tomar decisiones ms saludables antes de comprar [employer].  Haga una lista para el almacn y resptela. La coccin  Intente cocinar sus alimentos preferidos de una manera ms saludable. Por ejemplo, pruebe hornear en vez de frer.  Utilice productos lcteos descremados. Planificacin de los alimentos  Utilice ms frutas y verduras. La mitad de sus platos debe ser de frutas y verduras.  Incluya protenas [employer] y el pescado. Cmo puedo hacer el recuento de caloras cuando como afuera?  Pida porciones ms pequeas.  Considere la posibilidad de Publishing rights manager un plato principal y las guarniciones, en lugar de pedir su propio plato principal.  Si pide su propio plato principal, coma solo la mitad. Pida una caja al comienzo de la comida y ponga all el resto del plato principal, para no sentir la tentacin de comerlo.  Si se detallan las caloras en el men, elija las opciones que contengan la menor cantidad.  Elija platos que incluyan verduras, frutas, cereales integrales, productos lcteos con bajo contenido de grasa y Advertising account planner.  Opte por los alimentos hervidos, asados, cocidos a la parrilla o al vapor. No coma alimentos que contengan mantequilla, estn empanados, fritos o que se sirvan con salsa a base de crema. Generalmente, los alimentos que se etiquetan como "crujientes" estn fritos, a menos que se indique lo contrario.  Elija el agua, la [place], [place] t helado sin azcar u otras bebidas que no contengan azcares agregados. Si desea una bebida alcohlica, escoja una opcin con menos caloras como una copa de vino o una cerveza ligera.  Ordene los [employer], las salsas y los jarabes aparte. Estos son, con  frecuencia, de alto contenido en caloras, por lo que debe limitar la cantidad que ingiere.  Si desea Katherine Mantle, elija una de hortalizas y pida carnes a la parrilla. Evite las guarniciones adicionales como el tocino, el queso o los alimentos fritos. Ordene el aderezo aparte o pida aceite de [place] y vinagre o limn para Haematologist.  Haga un clculo estimativo de la cantidad de porciones que le sirven. Por ejemplo, una porcin de arroz cocido equivale a media taza o la mitad del tamao de una pelota de bisbol. Conocer el tamao de las porciones lo ayudar a Personnel officer atento a la cantidad de comida que come [employer]idental Petroleum. La lista que sigue le [place] el tamao de algunas porciones comunes a partir de objetos cotidianos: ? 1onza (28g) =  4dados apilados. ? 3onzas (85g) = 44mazo de cartas. ? 1cucharadita = 1dado. ? 1cucharada = media pelota de tenis de mesa. ? 2cucharadas = 1pelota de tenis de mesa. ? Media taza = media pelota de bisbol. ? 1taza = 1 pelota de bisbol. Resumen  El recuento de caloras es el registro de la cantidad de caloras que come y Pharmacologist. Si come menos caloras de las que el cuerpo necesita, debe bajar de [place].  Una cantidad de peso saludable para bajar por semana suele ser [place] 1 y Ivar Drape (0,5 a 0,9kg). Esto significa, con frecuencia, reducir su ingesta diaria de caloras unas 500 a 750 caloras.  Es posible Animator cantidad de caloras que contiene un alimento en la etiqueta de informacin nutricional. Si un alimento no tiene una etiqueta de informacin nutricional, intente buscar las caloras en Internet o pida ayuda al nutricionista.  Use las caloras de los alimentos y las bebidas que lo sacien y no de los alimentos y las bebidas que lo dejan con apetito.  Use platos, vasos y tazones ms pequeos para no comer en exceso. Esta informacin no tiene Marine scientist el consejo del mdico. Asegrese de hacerle al mdico cualquier pregunta  que tenga. Document Released: 10/08/2008 Document Revised: 09/21/2016 Document Reviewed: 09/21/2016 Elsevier Interactive Patient Education  [employer].

## 2018-03-08 NOTE — Progress Notes (Signed)
9/3/201910:26 AM  [name] October 28, 1970, 47 y.o. female [id number]  Chief Complaint  Patient presents with  . Hypertension    follow up on bp and discuss labs from last visit. Feeling very tired all the time  . Pain    having pain in the feet and legs    HPI:   Patient is a 47 y.o. female with past medical history significant for HTN, GERD who presents today for followup on BP  Last visit restarted lisinopril 10mg  Has seen GI for worsening reflux, schedule for EGD  Recent labs A1c 5.7, LDL 132, ALT 57 Works from 630am to [employer], heavy work, up and down stairs Has not been taking lunch to work so when she gets home very hungry Has been trying to eat less due to weight gain Has noticed also increase in knee and feet pain Does not drink tea or juice, very rare soda Under lots of stress of recent  Fall Risk  03/08/2018 02/25/2018 02/04/2018 12/21/2017  Falls in the past year? No No No No     Depression screen [facility] 2/9 03/08/2018 02/25/2018 02/04/2018  Decreased Interest 0 0 0  Down, Depressed, Hopeless 0 0 0  PHQ - 2 Score 0 0 0    Allergies  Allergen Reactions  . Fish Allergy Nausea And Vomiting    Prior to Admission medications   Medication Sig Start Date End Date Taking? Authorizing Provider  albuterol (PROVENTIL HFA;VENTOLIN HFA) 108 (90 Base) MCG/ACT inhaler Inhale 2-3 puffs into the lungs every 6 (six) hours as needed for wheezing or shortness of breath.   Yes [provider]  cetirizine (ZYRTEC ALLERGY) 10 MG tablet Take 10 mg by mouth daily as needed for allergies.    Yes [provider]  ibuprofen (ADVIL,MOTRIN) 200 MG tablet Take 200 mg by mouth every 6 (six) hours as needed.   Yes [provider]  lisinopril (PRINIVIL,ZESTRIL) 10 MG tablet Take 1 tablet (10 mg total) by mouth daily. 02/04/18  Yes Rutherford Guys, MD  Omeprazole-Sodium Bicarbonate (ZEGERID) 20-1100 MG CAPS capsule Take 1 capsule by mouth daily before breakfast. As needed    Yes [provider]    Past Medical History:  Diagnosis Date  . Adnexal mass    Left  . Asthma    only in Spring- allergic to pollens  . Complication of anesthesia    had a lot of bleeding and blood clots from vagina after surgery in [place]. [place] over 15 years ago, had to be readmitted to hospital, no blood transfusion was necessary  . GERD (gastroesophageal reflux disease)   . Headache    occ  . Hypertension    Not taking any medication at this time, Was taking Lisinopril 10 mg but has not had any for 1 month ran out of prescription, didnt have a PCP until 12/2017  . Poison ivy 12/2017  . SUI (stress urinary incontinence, female)   . Vitamin D deficiency     Past Surgical History:  Procedure Laterality Date  . CESAREAN SECTION    . LAPAROSCOPIC TUBAL LIGATION Bilateral 08/22/2012   Procedure: LAPAROSCOPIC TUBAL LIGATION;  Surgeon: Woodroe Mode, MD;  Location: [facility];  Service: Gynecology;  Laterality: Bilateral;  IUD removal  . LIPOSUCTION TRUNK  2010  . OTHER SURGICAL HISTORY     Had a surgery in [place]. [place] unknown name,  . REDUCTION MAMMAPLASTY  2010  . ROBOTIC ASSISTED LAPAROSCOPIC LYSIS OF ADHESION  01/03/2018   Procedure:  XI ROBOTIC ASSISTED LAPAROSCOPIC LYSIS OF ADHESIONS BETWEEN OMENTUM AND ABDOMINAL WALL;  Surgeon: Princess Bruins, MD;  Location: [facility];  Service: Gynecology;;    Social History   Tobacco Use  . Smoking status: Never Smoker  . Smokeless tobacco: Never Used  Substance Use Topics  . Alcohol use: No    Family History  Problem Relation Age of Onset  . Heart disease Father   . Diabetes Sister   . Hypertension Sister   . Hypertension Brother   . Liver disease Mother   . Breast cancer Maternal Aunt     Review of Systems  Constitutional: Negative for chills and fever.  Respiratory: Negative for cough and shortness of breath.   Cardiovascular: Negative for chest pain, palpitations and leg swelling.  Gastrointestinal: Negative for  abdominal pain, nausea and vomiting.   Per hpi  OBJECTIVE:  Blood pressure 133/83, pulse 74, temperature 98.1 F (36.7 C), temperature source Oral, height 5' 2.21" (1.58 m), weight 164 lb 9.6 oz (74.7 kg), last menstrual period 01/04/2018, SpO2 97 %. Body mass index is 29.91 kg/m.   Wt Readings from Last 3 Encounters:  03/08/18 164 lb 9.6 oz (74.7 kg)  02/25/18 163 lb (73.9 kg)  02/14/18 164 lb (74.4 kg)    Physical Exam  Constitutional: She is oriented to person, place, and time. She appears well-developed and well-nourished.  HENT:  Head: Normocephalic and atraumatic.  Mouth/Throat: Mucous membranes are normal.  Eyes: Pupils are equal, round, and reactive to light. Conjunctivae and EOM are normal. No scleral icterus.  Neck: Neck supple.  Pulmonary/Chest: Effort normal.  Neurological: She is alert and oriented to person, place, and time.  Skin: Skin is warm and dry.  Psychiatric: She has a normal mood and affect.  Nursing note and vitals reviewed.   ASSESSMENT and PLAN  1. Essential hypertension, benign Controlled. Continue current regime.   2. Pre-diabetes New diagnosis. discsused low carb diet, regular exercise and healthy weight. Recheck a1c at next visit  3. BMI 29.0-29.9,adult See #2  Other orders - lisinopril (PRINIVIL,ZESTRIL) 10 MG tablet; Take 1 tablet (10 mg total) by mouth daily.  Return in about 6 months (around 09/06/2018).    Rutherford Guys, MD [facility] at [facility] [place], [place] [address] Ph.  [phone] Fax [phone]

## 2018-03-11 ENCOUNTER — Ambulatory Visit (AMBULATORY_SURGERY_CENTER): Payer: BLUE CROSS/BLUE SHIELD | Admitting: Gastroenterology

## 2018-03-11 ENCOUNTER — Encounter: Payer: Self-pay | Admitting: Gastroenterology

## 2018-03-11 ENCOUNTER — Ambulatory Visit: Payer: BLUE CROSS/BLUE SHIELD | Admitting: Family Medicine

## 2018-03-11 VITALS — BP 131/75 | HR 60 | Temp 97.7°F | Resp 15 | Ht 62.0 in | Wt 164.0 lb

## 2018-03-11 DIAGNOSIS — R1319 Other dysphagia: Secondary | ICD-10-CM

## 2018-03-11 DIAGNOSIS — G8929 Other chronic pain: Secondary | ICD-10-CM

## 2018-03-11 DIAGNOSIS — K219 Gastro-esophageal reflux disease without esophagitis: Secondary | ICD-10-CM | POA: Diagnosis not present

## 2018-03-11 DIAGNOSIS — R1011 Right upper quadrant pain: Secondary | ICD-10-CM

## 2018-03-11 DIAGNOSIS — R1310 Dysphagia, unspecified: Secondary | ICD-10-CM | POA: Diagnosis not present

## 2018-03-11 DIAGNOSIS — R140 Abdominal distension (gaseous): Secondary | ICD-10-CM | POA: Diagnosis not present

## 2018-03-11 MED ORDER — SODIUM CHLORIDE 0.9 % IV SOLN: 500.0000 mL | Freq: Once | INTRAVENOUS | Status: DC

## 2018-03-11 NOTE — Progress Notes (Signed)
Called to room to assist during endoscopic procedure.  Patient ID and intended procedure confirmed with present staff. Received instructions for my participation in the procedure from the performing physician.  

## 2018-03-11 NOTE — Patient Instructions (Signed)
Please return to GI clinic in 3 months. Follow Dilation diet today. Continue present medications.   YOU HAD AN ENDOSCOPIC PROCEDURE TODAY AT [facility]:   Refer to the procedure report that was given to you for any specific questions about what was found during the examination.  If the procedure report does not answer your questions, please call your gastroenterologist to clarify.  If you requested that your care partner not be given the details of your procedure findings, then the procedure report has been included in a sealed envelope for you to review at your convenience later.  YOU SHOULD EXPECT: Some feelings of bloating in the abdomen. Passage of more gas than usual.  Walking can help get rid of the air that was put into your GI tract during the procedure and reduce the bloating. If you had a lower endoscopy (such as a colonoscopy or flexible sigmoidoscopy) you may notice spotting of blood in your stool or on the toilet paper. If you underwent a bowel prep for your procedure, you may not have a normal bowel movement for a few days.  Please Note:  You might notice some irritation and congestion in your nose or some drainage.  This is from the oxygen used during your procedure.  There is no need for concern and it should clear up in a day or so.  SYMPTOMS TO REPORT IMMEDIATELY:    Following upper endoscopy (EGD)  Vomiting of blood or coffee ground material  New chest pain or pain under the shoulder blades  Painful or persistently difficult swallowing  New shortness of breath  Fever of 100F or higher  Black, tarry-looking stools  For urgent or emergent issues, a gastroenterologist can be reached at any hour by calling [phone].   Diet  Drink plenty of fluids but you should avoid alcoholic beverages for 24 hours.  ACTIVITY:  You should plan to take it easy for the rest of today and you should NOT DRIVE or use heavy machinery until tomorrow (because of the  sedation medicines used during the test).    FOLLOW UP: Our staff will call the number listed on your records the next business day following your procedure to check on you and address any questions or concerns that you may have regarding the information given to you following your procedure. If we do not reach you, we will leave a message.  However, if you are feeling well and you are not experiencing any problems, there is no need to return our call.  We will assume that you have returned to your regular daily activities without incident.  If any biopsies were taken you will be contacted by phone or by letter within the next 1-3 weeks.  Please call us at [phone] if you have not heard about the biopsies in 3 weeks.    SIGNATURES/CONFIDENTIALITY: You and/or your care partner have signed paperwork which will be entered into your electronic medical record.  These signatures attest to the fact that that the information above on your After Visit Summary has been reviewed and is understood.  Full responsibility of the confidentiality of this discharge information lies with you and/or your care-partner.

## 2018-03-11 NOTE — Progress Notes (Signed)
A/ox3 pleased with MAC, report to RN 

## 2018-03-11 NOTE — Op Note (Signed)
[facility] Patient Name: [name] Procedure Date: 03/11/2018 9:48 AM MRN: [id number] Endoscopist: Gerrit Heck , MD Age: 47 Referring MD:  Date of Birth: [birth year] Gender: Female Account #: [id number] Procedure:                Upper GI endoscopy Indications:              Epigastric abdominal pain, Abdominal pain in the                            right upper quadrant, Dysphagia to solids,                            Suspected esophageal reflux, Abdominal bloating Medicines:                Monitored Anesthesia Care Procedure:                Pre-Anesthesia Assessment:                           - Prior to the procedure, a History and Physical                            was performed, and patient medications and                            allergies were reviewed. The patient's tolerance of                            previous anesthesia was also reviewed. The risks                            and benefits of the procedure and the sedation                            options and risks were discussed with the patient.                            All questions were answered, and informed consent                            was obtained. Prior Anticoagulants: The patient has                            taken no previous anticoagulant or antiplatelet                            agents. ASA Grade Assessment: II - A patient with                            mild systemic disease. After reviewing the risks                            and benefits, the patient was deemed in  satisfactory condition to undergo the procedure.                           After obtaining informed consent, the endoscope was                            passed under direct vision. Throughout the                            procedure, the patient's blood pressure, pulse, and                            oxygen saturations were monitored continuously. The                            Endoscope  was introduced through the mouth, and                            advanced to the third part of duodenum. The upper                            GI endoscopy was accomplished without difficulty.                            The patient tolerated the procedure well. Scope In: Scope Out: Findings:                 A single area of ectopic gastric mucosa was found                            in the upper third of the esophagus.                           The examined esophagus was otherwise normal. The                            scope was withdrawn. Dilation was performed with a                            Maloney dilator with no resistance at 52 Fr. The                            dilation site was examined following endoscope                            reinsertion and showed no bleeding, mucosal tear or                            perforation. Estimated blood loss: none.                           Esophagogastric landmarks were identified: the  Z-line was found at 37 cm, the gastroesophageal                            junction was found at 37 cm and the site of hiatal                            narrowing was found at 37 cm from the incisors.                           The Z-line was regular and was found 37 cm from the                            incisors.                           The gastroesophageal flap valve was visualized                            endoscopically and classified as Hill Grade II                            (fold present, opens with respiration).                           The entire examined stomach was normal. Biopsies                            were taken with a cold forceps for Helicobacter                            pylori testing. Estimated blood loss was minimal.                           The duodenal bulb, first portion of the duodenum,                            second portion of the duodenum and third portion of                            the  duodenum were normal. Biopsies for histology                            were taken with a cold forceps for evaluation of                            celiac disease. Estimated blood loss was minimal. Complications:            No immediate complications. Estimated Blood Loss:     Estimated blood loss was minimal. Impression:               - Ectopic gastric mucosa in the upper third of the  esophagus.                           - Normal esophagus. Dilated. No mucosal rent, tear,                            or bleeding noted on reinspection.                           - Esophagogastric landmarks identified as above.                           - Z-line regular, 37 cm from the incisors.                           - Gastroesophageal flap valve classified as Hill                            Grade II (fold present, opens with respiration).                           - Normal stomach. Biopsied.                           - Normal duodenal bulb, first portion of the                            duodenum, second portion of the duodenum and third                            portion of the duodenum. Biopsied. Recommendation:           - Patient has a contact number available for                            emergencies. The signs and symptoms of potential                            delayed complications were discussed with the                            patient. Return to normal activities tomorrow.                            Written discharge instructions were provided to the                            patient.                           - Resume previous diet today.                           - Continue present medications.                           -  Await pathology results.                           - Return to GI clinic in 3 months to review                            efficacy of medications and today's intervention. Gerrit Heck, MD 03/11/2018 10:06:06 AM

## 2018-03-14 ENCOUNTER — Telehealth: Payer: Self-pay | Admitting: *Deleted

## 2018-03-14 NOTE — Telephone Encounter (Signed)
  Follow up Call-  Call back number 03/11/2018  Post procedure Call Back phone  # [phone]  Permission to leave phone message Yes  Some recent data might be hidden     Patient questions:  Do you have a fever, pain , or abdominal swelling? No. Pain Score  0 *  Have you tolerated food without any problems? Yes.    Have you been able to return to your normal activities? Yes.    Do you have any questions about your discharge instructions: Diet   No. Medications  No. Follow up visit  No.  Do you have questions or concerns about your Care? No.  Actions: * If pain score is 4 or above: No action needed, pain <4.

## 2018-03-16 ENCOUNTER — Other Ambulatory Visit: Payer: Self-pay

## 2018-03-16 ENCOUNTER — Encounter: Payer: Self-pay | Admitting: Gastroenterology

## 2018-03-16 DIAGNOSIS — A048 Other specified bacterial intestinal infections: Secondary | ICD-10-CM

## 2018-03-16 MED ORDER — METRONIDAZOLE 250 MG PO TABS: 250.0000 mg | ORAL_TABLET | Freq: Four times a day (QID) | ORAL | 0 refills | Status: AC

## 2018-03-16 MED ORDER — OMEPRAZOLE 20 MG PO CPDR: 20.0000 mg | DELAYED_RELEASE_CAPSULE | Freq: Two times a day (BID) | ORAL | 0 refills | Status: DC

## 2018-03-16 MED ORDER — BISMUTH SUBSALICYLATE 262 MG PO TABS: 2.0000 | ORAL_TABLET | Freq: Four times a day (QID) | ORAL | 0 refills | Status: AC

## 2018-03-16 MED ORDER — DOXYCYCLINE HYCLATE 100 MG PO CAPS: 100.0000 mg | ORAL_CAPSULE | Freq: Two times a day (BID) | ORAL | 0 refills | Status: AC

## 2018-05-02 ENCOUNTER — Telehealth: Payer: Self-pay | Admitting: *Deleted

## 2018-05-02 ENCOUNTER — Other Ambulatory Visit: Payer: Self-pay | Admitting: Family Medicine

## 2018-05-02 MED ORDER — NORETHINDRONE ACETATE 5 MG PO TABS: 5.0000 mg | ORAL_TABLET | Freq: Two times a day (BID) | ORAL | 0 refills | Status: DC

## 2018-05-02 NOTE — Telephone Encounter (Signed)
Patient informed, Rx sent until OV on 05/05/18

## 2018-05-02 NOTE — Telephone Encounter (Signed)
No need to work-in.  This is a 23 day cycle which is wnl.  For heavy regular menses, I recommend sending Aygestin 5 mg/tab 2 tab daily until her visit with me.  Bring in for labs CBC, TSH.  Add a Pelvic US on 05/05/2018 visit.

## 2018-05-02 NOTE — Telephone Encounter (Signed)
Yes, keep appointment

## 2018-05-02 NOTE — Telephone Encounter (Signed)
Patient called c/o heavy bleeding with clots, Post op Robotic left oophorectomy with bilateral salpingectomy and extensive lysis of adhesions on January 03, 2018.   Patient c/o 2 cycles this month, 04/06/18 lasted 11 days and then again on 04/29/18 heavy bleeding changing pad every 30 minutes with clots, she is scheduled for OV on 05/05/18. Should patient be a work-in for today? Please advise

## 2018-05-02 NOTE — Telephone Encounter (Signed)
Patient informed with below note, no ultrasound available on 05/05/18 per appointment desk.  Should patient keep scheduled visit and come back for ultrasound at next available slot? Please advise

## 2018-05-05 ENCOUNTER — Telehealth: Payer: Self-pay | Admitting: *Deleted

## 2018-05-05 ENCOUNTER — Ambulatory Visit: Payer: BLUE CROSS/BLUE SHIELD | Admitting: Obstetrics & Gynecology

## 2018-05-05 NOTE — Telephone Encounter (Signed)
Patient was scheduled today for heavy bleeding, but appointment was cancelled due to late arrival . I called patient to check on her, left message for her to call.

## 2018-05-09 ENCOUNTER — Other Ambulatory Visit: Payer: Self-pay | Admitting: *Deleted

## 2018-05-09 DIAGNOSIS — N926 Irregular menstruation, unspecified: Secondary | ICD-10-CM

## 2018-05-13 ENCOUNTER — Encounter: Payer: Self-pay | Admitting: Gastroenterology

## 2018-05-13 ENCOUNTER — Other Ambulatory Visit (INDEPENDENT_AMBULATORY_CARE_PROVIDER_SITE_OTHER): Payer: BLUE CROSS/BLUE SHIELD

## 2018-05-13 ENCOUNTER — Ambulatory Visit (INDEPENDENT_AMBULATORY_CARE_PROVIDER_SITE_OTHER): Payer: BLUE CROSS/BLUE SHIELD | Admitting: Gastroenterology

## 2018-05-13 VITALS — BP 126/74 | HR 83 | Ht 61.0 in | Wt 158.4 lb

## 2018-05-13 DIAGNOSIS — R21 Rash and other nonspecific skin eruption: Secondary | ICD-10-CM

## 2018-05-13 DIAGNOSIS — Z8619 Personal history of other infectious and parasitic diseases: Secondary | ICD-10-CM

## 2018-05-13 DIAGNOSIS — R140 Abdominal distension (gaseous): Secondary | ICD-10-CM

## 2018-05-13 DIAGNOSIS — K219 Gastro-esophageal reflux disease without esophagitis: Secondary | ICD-10-CM

## 2018-05-13 LAB — H. PYLORI ANTIBODY, IGG: H Pylori IgG: POSITIVE — AB

## 2018-05-13 MED ORDER — OMEPRAZOLE 20 MG PO CPDR: 20.0000 mg | DELAYED_RELEASE_CAPSULE | Freq: Every day | ORAL | 3 refills | Status: DC

## 2018-05-13 NOTE — Patient Instructions (Addendum)
If you are age 49 or older, your body mass index should be between 23-30. Your Body mass index is 29.92 kg/m. If this is out of the aforementioned range listed, please consider follow up with your Primary Care Provider.  If you are age 60 or younger, your body mass index should be between 19-25. Your Body mass index is 29.92 kg/m. If this is out of the aformentioned range listed, please consider follow up with your Primary Care Provider.   We have sent the following medications to your pharmacy for you to pick up at your convenience: Prilosec 20mg  daily  Please go to the lab on the 2nd floor [address] before you leave the office today.   It was a pleasure to see you today!  Gerrit Heck, D.O.   You will receive a call from Dermatology regarding an appointment.

## 2018-05-13 NOTE — Progress Notes (Signed)
P  Chief Complaint:    Reflux, bloating  GI History: 47 year old female initially seen by me on 02/14/2018 for reflux, abdominal bloating, and dysphagia.  1) Abdominal bloating: Abdominal bloating described as postprandial x2 years with increasing frequency, but not present with "simple foods"or fasting.  Had a tummy tuck in 2010 which limits distensibility of the lower abdomen, but otherwise notices upper abdominal visible distention.  EGD on 03/11/2018 notable for H. pylori gastritis, treated with quad therapy. Bloating largely resolved since then.   2) Reflux: Index reflux symptoms of heartburn and regurgitation.  Exacerbated by spicy foods.  Previously treated with Zegerid as needed, taken biweekly.  EGD completed on 03/11/2018 and notable for normal Z line and Hill grade 2 valve without significant hiatal hernia. Reflux was actually well controlled when on daily PPI for H pylori tx, without breakthrough or need for prn meds during that 2 weeks.   3) Dysphagia: Solid food dysphagia, pointing to his suprasternal notch.  No prior food impactions.  Present for 2 years or so.  EGD on 03/11/2018 with normal-appearing esophagus.  Empiric dilation with 52 French Maloney dilator. Resolved now.   HPI:     Patient is a 47 y.o. female presenting to the Gastroenterology Clinic for follow-up.  She was initially seen by me on 02/14/2018 with subsequent EGD on 03/11/2018 as noted below.  EGD notable for H. pylori gastritis and was prescribed quad therapy. Completed Rx as prescribed. Does report a new skin rash on b/l forearms and left cheek- darkening with some pruritis. Started after completion of Abx.   Today she states she continues to take Zegerid prn, taken 2 times/week. No change from prior. However, her reflux was well controlled during the 2 weeks of PPI as part of quad therapy, without breakthrough symptoms or need for as needed Zegerid.  She does report reduced appetite over the last 3 weeks or so,  without any nausea, vomiting, pain.  No early satiety.  Dysphasia resolved after last EGD with dilation.  Has lost a few pounds due to reduced appetite.  No associated night sweats, fever, chills.  No change in bowel habits, hematochezia, melena.   Abdominal distenstion/bloating seems to have resolved since changing appetite and possibly after recent Abx.    Endoscopic history: -EGD (03/11/2018, Dr. Bryan Lemma): Gastric inlet patch, normal esophagus, empiric dilation with 52 French Maloney without resistance and no mucosal tear, normal Z line, Hill grade 2, normal stomach but biopsies positive for H. pylori (treated with quad therapy), normal duodenum with benign duodenal biopsies.  Review of systems:     No chest pain, no SOB, no fevers, no urinary sx   Past Medical History:  Diagnosis Date  . Adnexal mass    Left  . Allergy    pollen allergy  . Asthma    only in Spring- allergic to pollens  . Complication of anesthesia    had a lot of bleeding and blood clots from vagina after surgery in [place]. [place] over 15 years ago, had to be readmitted to hospital, no blood transfusion was necessary  . GERD (gastroesophageal reflux disease)   . Headache    occ  . Hypertension    Not taking any medication at this time, Was taking Lisinopril 10 mg but has not had any for 1 month ran out of prescription, didnt have a PCP until 12/2017  . Poison ivy 12/2017  . SUI (stress urinary incontinence, female)   . Vitamin D deficiency  Patient's surgical history, family medical history, social history, medications and allergies were all reviewed in Epic    Current Outpatient Medications  Medication Sig Dispense Refill  . albuterol (PROVENTIL HFA;VENTOLIN HFA) 108 (90 Base) MCG/ACT inhaler Inhale 2-3 puffs into the lungs every 6 (six) hours as needed for wheezing or shortness of breath.    . cetirizine (ZYRTEC ALLERGY) 10 MG tablet Take 10 mg by mouth daily as needed for allergies.     Marland Kitchen ibuprofen  (ADVIL,MOTRIN) 200 MG tablet Take 200 mg by mouth every 6 (six) hours as needed.    Marland Kitchen lisinopril (PRINIVIL,ZESTRIL) 10 MG tablet Take 1 tablet (10 mg total) by mouth daily. 90 tablet 3  . norethindrone (AYGESTIN) 5 MG tablet Take 1 tablet (5 mg total) by mouth 2 (two) times daily. 8 tablet 0  . Omeprazole-Sodium Bicarbonate (ZEGERID) 20-1100 MG CAPS capsule Take 1 capsule by mouth daily before breakfast. As needed     No current facility-administered medications for this visit.     Physical Exam:     BP 126/74   Pulse 83   Ht 5\' 1"  (1.549 m)   Wt 158 lb 6 oz (71.8 kg)   BMI 29.92 kg/m   GENERAL:  Pleasant female in NAD PSYCH: : Cooperative, normal affect EENT:  conjunctiva pink, mucous membranes moist, neck supple without masses CARDIAC:  RRR, no murmur heard, no peripheral edema PULM: Normal respiratory effort, lungs CTA bilaterally, no wheezing ABDOMEN:  Nondistended, soft, nontender. No obvious masses, no hepatomegaly,  normal bowel sounds SKIN:  turgor, no lesions seen Musculoskeletal:  Normal muscle tone, normal strength NEURO: Alert and oriented x 3, no focal neurologic deficits   IMPRESSION and PLAN:    #1.  GERD: Reflux symptoms were well controlled on daily PPI therapy during recent quad therapy for H. pylori.  She would like to start back on a daily therapy instead of continuing with as needed only, for both diagnostic and therapeutic intent.  -Start Prilosec 20 mg daily.  As her symptoms are mainly daytime, will take 30 to 60 minutes before breakfast -Resume antireflux lifestyle measures - We will reassess for medication efficacy in 6 to 12 weeks.  Did discuss that if symptoms well controlled on PPI, could entertain further evaluation with either esophagram or pH/impedance study (off PPI) as part of a preoperative evaluation for TIF.     #2.  Dysphagia: Resolved after EGD with dilation.  No difficulty tolerating p.o.  #3.  Abdominal Bloating: Reports that this is  essentially resolved after recent antibiotics.  Did briefly discuss the utility in a course of probiotics if recurrence of index symptoms.  We will also evaluate for symptoms being kept at [place] with starting daily acid suppression therapy as above.  #4.  Reduced appetite: Subjectively reduced appetite over the last few weeks that was not present on last encounter.  No mucosal or luminal etiology for the symptoms on recent EGD.  Will evaluate for clinical improvement with trial of PPI as above.  Otherwise no "B "symptoms.  #5. Skin rash: - Dermatology referral  I spent a total of 15 minutes of face-to-face time with the patient. Greater than 50% of the time was spent counseling and coordinating care.        [place] ,DO, FACG 05/13/2018, 10:20 AM

## 2018-05-24 ENCOUNTER — Encounter: Payer: Self-pay | Admitting: Family Medicine

## 2018-05-24 ENCOUNTER — Ambulatory Visit (INDEPENDENT_AMBULATORY_CARE_PROVIDER_SITE_OTHER): Payer: BLUE CROSS/BLUE SHIELD | Admitting: Family Medicine

## 2018-05-24 ENCOUNTER — Other Ambulatory Visit: Payer: Self-pay

## 2018-05-24 ENCOUNTER — Encounter

## 2018-05-24 VITALS — BP 135/86 | HR 72 | Temp 98.1°F | Ht 61.0 in | Wt 157.0 lb

## 2018-05-24 DIAGNOSIS — R200 Anesthesia of skin: Secondary | ICD-10-CM

## 2018-05-24 DIAGNOSIS — I10 Essential (primary) hypertension: Secondary | ICD-10-CM | POA: Diagnosis not present

## 2018-05-24 DIAGNOSIS — R202 Paresthesia of skin: Secondary | ICD-10-CM

## 2018-05-24 DIAGNOSIS — N938 Other specified abnormal uterine and vaginal bleeding: Secondary | ICD-10-CM | POA: Diagnosis not present

## 2018-05-24 DIAGNOSIS — J45909 Unspecified asthma, uncomplicated: Secondary | ICD-10-CM

## 2018-05-24 MED ORDER — GABAPENTIN 300 MG PO CAPS: 300.0000 mg | ORAL_CAPSULE | Freq: Every day | ORAL | 0 refills | Status: DC

## 2018-05-24 MED ORDER — ALBUTEROL SULFATE HFA 108 (90 BASE) MCG/ACT IN AERS: 2.0000 | INHALATION_SPRAY | Freq: Four times a day (QID) | RESPIRATORY_TRACT | 3 refills | Status: DC | PRN

## 2018-05-24 MED ORDER — MOMETASONE FUROATE 0.1 % EX OINT: TOPICAL_OINTMENT | Freq: Every day | CUTANEOUS | 2 refills | Status: DC

## 2018-05-24 NOTE — Patient Instructions (Addendum)
If you have lab work done today you will be contacted with your lab results within the next 2 weeks.  If you have not heard from [place] then please contact us. The fastest way to get your results is to register for My Chart.   IF you received an x-ray today, you will receive an invoice from [facility]. Please contact [facility] at [phone] with questions or concerns regarding your invoice.   IF you received labwork today, you will receive an invoice from [facility]. Please contact [facility] at [phone] with questions or concerns regarding your invoice.   Our billing staff will not be able to assist you with questions regarding bills from these companies.  You will be contacted with the lab results as soon as they are available. The fastest way to get your results is to activate your My Chart account. Instructions are located on the last page of this paperwork. If you have not heard from [place] regarding the results in 2 weeks, please contact this office.     [place] en la mueca (Wrist Splint) Una frula en la mueca mantiene la [place] en una posicin fija de modo que no se mueva. Es un aparato que inmoviliza la [place], pero que es flexible, y puede quitarse o Public house manager. Tal vez deba usar una frula si se lesiona o se le hincha la [place]. La frula de [place] puede:  Mantener inmvil la [place].  Brindar proteccin contra una lesin de la [place].  Evitar otra lesin de la [place].  Evitar el movimiento de la [place].  Aliviar el dolor.  Ayudar a la curacin de la [place]. RIESGOS Y COMPLICACIONES Si [place] la frula demasiado ajustada o la hinchazn es importante, es posible que disminuya la irrigacin sangunea a la mueca o la [place]. Esto puede causar una afeccin llamada sndrome compartimental, que puede ser peligroso y provocar daos permanentes. Los sntomas son los siguientes:  Dolor en la mueca que [place].  Hormigueo y adormecimiento.  Cambios en el color  de la piel (palidez o color azulado).  Dedos fros. El uso de una frula conlleva otros riesgos que pueden ser los siguientes:  Irritacin de la piel que puede causar lo siguiente: ? Picazn. ? Erupcin cutnea. ? Llagas en la piel. ? Infeccin en la piel.  Rigidez de [employer]. Esto puede ocurrir si se ha usado una frula durante [place]. CMO USAR LA FRULA EN LA MUECA La frula debe estar lo bastante [place] como para mantener inmvil la [place]. No debe interrumpir la irrigacin sangunea. El [employer] dir cmo usar la frula en la [place] y durante cunto [place].  Siga todas las indicaciones del [place] los medicamentos solamente como se lo haya indicado el mdico.  Cuando est en reposo, mantenga la mueca elevada por encima del nivel del corazn.  El hielo puede ayudar a Best boy y reducir la hinchazn. ? Coloque el hielo en una bolsa plstica. ? Coloque una [employer] frula y la bolsa de hielo. ? Coloque el hielo durante 20 minutos, 2 a 3 veces por da.  No moje la frula.  No introduzca ningn objeto debajo de la frula para rascarse la piel.  Afljese la frula si est muy ajustada. Hable con el mdico si tiene dudas sobre qu tan ajustada debe usar la frula.  Concurra a todas las visitas de control como se lo haya indicado el mdico. Esto es importante. SOLICITE AYUDA SI:  Tiene dolor o hinchazn  de mueca que no desaparecen.  La piel alrededor o debajo de la frula se enrojece, le pica o est hmeda.  Siente escalofros o tiene fiebre.  La frula est muy ajustada o muy floja.  La frula se rompe. SOLICITE AYUDA DE INMEDIATO SI: Tiene los siguientes sntomas:  Dolor en la [employer].  Hormigueo y adormecimiento.  Cambios en el color de la piel (palidez o color azulado).  Dedos fros. ASEGRESE DE QUE:  Comprende estas instrucciones.  Controlar su afeccin.  Recibir ayuda de inmediato si no mejora o si  empeora. Esta informacin no tiene Marine scientist el consejo del mdico. Asegrese de hacerle al mdico cualquier pregunta que tenga. Document Released: 09/18/2008 Document Revised: 07/13/2014 Document Reviewed: 10/03/2013 Elsevier Interactive Patient Education  2018 [employer].

## 2018-05-24 NOTE — Progress Notes (Signed)
11/19/201910:40 AM  [name] 1970-09-17, 47 y.o. female [id number]  Chief Complaint  Patient presents with  . Medication Refill    albuterol and mometasone furoate  . Menorrhagia    last month had period twice with alot of clotting. Wants a referral for another gyn    HPI:   Patient is a 47 y.o. female with past medical history significant for HTN, GERD, seasonal allergies with asthma who presents today for requesting refill of medications  Currently seeing Dr Dellis Filbert Had oopherectomy Has upcoming appt for [place] as she was having increased bleeding Requesting a referral to different gyn  Asthma doing well Needs refills of albuterol as it has expired Has not had any need for it of recent  Also requesting refill of elocon  Recent EGD + h pylori, treated with triple therapy Starting to feel better, appetite slowly returning  Having nocturnal arms numbness, mostly at night Denies any weakness R> L Right handed   Fall Risk  05/24/2018 03/08/2018 02/25/2018 02/04/2018 12/21/2017  Falls in the past year? 0 No No No No     Depression screen [facility] 2/9 03/08/2018 02/25/2018 02/04/2018  Decreased Interest 0 0 0  Down, Depressed, Hopeless 0 0 0  PHQ - 2 Score 0 0 0    Allergies  Allergen Reactions  . Fish Allergy Nausea And Vomiting    Prior to Admission medications   Medication Sig Start Date End Date Taking? Authorizing Provider  albuterol (PROVENTIL HFA;VENTOLIN HFA) 108 (90 Base) MCG/ACT inhaler Inhale 2-3 puffs into the lungs every 6 (Pruitt) hours as needed for wheezing or shortness of breath.   Yes [provider]  cetirizine (ZYRTEC ALLERGY) 10 MG tablet Take 10 mg by mouth daily as needed for allergies.    Yes [provider]  ibuprofen (ADVIL,MOTRIN) 200 MG tablet Take 200 mg by mouth every 6 (Pruitt) hours as needed.   Yes [provider]  lisinopril (PRINIVIL,ZESTRIL) 10 MG tablet Take 1 tablet (10 mg total) by mouth daily. 03/08/18  Yes  Rutherford Guys, MD  mometasone (ELOCON) 0.1 % ointment Apply topically daily.   Yes [provider]  omeprazole (PRILOSEC) 20 MG capsule Take 1 capsule (20 mg total) by mouth daily. 05/13/18  Yes Cirigliano, Vito V, DO  Omeprazole-Sodium Bicarbonate (ZEGERID) 20-1100 MG CAPS capsule Take 1 capsule by mouth daily before breakfast. As needed   Yes [provider]    Past Medical History:  Diagnosis Date  . Adnexal mass    Left  . Allergy    pollen allergy  . Asthma    only in Spring- allergic to pollens  . Complication of anesthesia    had a lot of bleeding and blood clots from vagina after surgery in [place]. [place] over 15 years ago, had to be readmitted to hospital, no blood transfusion was necessary  . GERD (gastroesophageal reflux disease)   . Headache    occ  . Hypertension    Not taking any medication at this time, Was taking Lisinopril 10 mg but has not had any for 1 month ran out of prescription, didnt have a PCP until 12/2017  . Poison ivy 12/2017  . SUI (stress urinary incontinence, female)   . Vitamin D deficiency     Past Surgical History:  Procedure Laterality Date  . CESAREAN SECTION    . LAPAROSCOPIC TUBAL LIGATION Bilateral 08/22/2012   Procedure: LAPAROSCOPIC TUBAL LIGATION;  Surgeon: Woodroe Mode, MD;  Location: [facility];  Service: Gynecology;  Laterality: Bilateral;  IUD removal  . LIPOSUCTION TRUNK  2010  . OTHER SURGICAL HISTORY     Had a surgery in [place]. [place] unknown name,  . REDUCTION MAMMAPLASTY  2010  . ROBOTIC ASSISTED LAPAROSCOPIC LYSIS OF ADHESION  01/03/2018   Procedure: XI ROBOTIC ASSISTED LAPAROSCOPIC LYSIS OF ADHESIONS BETWEEN OMENTUM AND ABDOMINAL WALL;  Surgeon: Princess Bruins, MD;  Location: [facility];  Service: Gynecology;;    Social History   Tobacco Use  . Smoking status: Never Smoker  . Smokeless tobacco: Never Used  Substance Use Topics  . Alcohol use: No    Family History  Problem Relation Age of Onset  . Heart  disease Father   . Diabetes Sister   . Hypertension Sister   . Hypertension Brother   . Liver disease Mother   . Breast cancer Maternal Aunt   . Colon cancer Neg Hx   . Esophageal cancer Neg Hx     Review of Systems  Constitutional: Negative for chills and fever.  Respiratory: Negative for cough and shortness of breath.   Cardiovascular: Negative for chest pain, palpitations and leg swelling.  Gastrointestinal: Negative for abdominal pain, nausea and vomiting.  Musculoskeletal: Negative for neck pain.  Neurological: Positive for tingling.   Per hpi  OBJECTIVE:  Blood pressure 135/86, pulse 72, temperature 98.1 F (36.7 C), temperature source Oral, height 5\' 1"  (1.549 m), weight 157 lb (71.2 kg), last menstrual period 04/11/2018, SpO2 100 %. Body mass index is 29.66 kg/m.   Physical Exam  Constitutional: She is oriented to person, place, and time. She appears well-developed and well-nourished.  HENT:  Head: Normocephalic and atraumatic.  Mouth/Throat: Oropharynx is clear and moist. No oropharyngeal exudate.  Eyes: Pupils are equal, round, and reactive to light. Conjunctivae and EOM are normal. No scleral icterus.  Neck: Neck supple.  Cardiovascular: Normal rate, regular rhythm and normal heart sounds. Exam reveals no gallop and no friction rub.  No murmur heard. Pulmonary/Chest: Effort normal and breath sounds normal. She has no wheezes. She has no rales.  Musculoskeletal: She exhibits no edema.  Normal ROM, strength, reflexes and pulses Mild decreased sensation right to light touch + phalen, tinel RUE + tinel LUE + elbow flexion test RUE   Neurological: She is alert and oriented to person, place, and time.  Skin: Skin is warm and dry.  Psychiatric: She has a normal mood and affect.  Nursing note and vitals reviewed.    ASSESSMENT and PLAN  1. DUB (dysfunctional uterine bleeding) - Ambulatory referral to Obstetrics / Gynecology  2. Essential hypertension,  benign Controlled. Continue current regime.   3. Asthma due to seasonal allergies Controlled. Continue current regime.   4. Numbness and tingling in both hands Discussed supportive measures, new meds r/se/b and RTC precautions. Patient educational handout given. Other orders - mometasone (ELOCON) 0.1 % ointment; Apply topically daily. - albuterol (PROVENTIL HFA;VENTOLIN HFA) 108 (90 Base) MCG/ACT inhaler; Inhale 2 puffs into the lungs every 6 (Pruitt) hours as needed for wheezing or shortness of breath. - gabapentin (NEURONTIN) 300 MG capsule; Take 1 capsule (300 mg total) by mouth at bedtime.  Return in about 3 months (around 08/24/2018).    Rutherford Guys, MD Primary Care at [place], [place] [address] Ph.  [phone] Fax [phone]

## 2018-06-01 ENCOUNTER — Ambulatory Visit (INDEPENDENT_AMBULATORY_CARE_PROVIDER_SITE_OTHER): Payer: BLUE CROSS/BLUE SHIELD

## 2018-06-01 ENCOUNTER — Encounter: Payer: Self-pay | Admitting: Obstetrics & Gynecology

## 2018-06-01 ENCOUNTER — Ambulatory Visit (INDEPENDENT_AMBULATORY_CARE_PROVIDER_SITE_OTHER): Payer: BLUE CROSS/BLUE SHIELD | Admitting: Obstetrics & Gynecology

## 2018-06-01 DIAGNOSIS — N921 Excessive and frequent menstruation with irregular cycle: Secondary | ICD-10-CM | POA: Diagnosis not present

## 2018-06-01 DIAGNOSIS — N951 Menopausal and female climacteric states: Secondary | ICD-10-CM | POA: Diagnosis not present

## 2018-06-01 DIAGNOSIS — N926 Irregular menstruation, unspecified: Secondary | ICD-10-CM | POA: Diagnosis not present

## 2018-06-01 NOTE — Progress Notes (Signed)
    CALLE SCHADER 02-Sep-1970 [id number]        47 y.o.  [id number]   RP: Menometrorrhagia in 04/2018  HPI: Had irregular, at times heavy vaginal bleeding for most of October 2019.  No bleeding at all in November.  No pelvic pain.  Mild hot flushes and night sweats.   OB History  Gravida Para Term Preterm AB Living  4 3 3  0 1 3  SAB TAB Ectopic Multiple Live Births  0 1 0 0      # Outcome Date GA Lbr Len/2nd Weight Sex Delivery Anes PTL Lv  4 TAB           3 Term           2 Term           1 Term             Past medical history,surgical history, problem list, medications, allergies, family history and social history were all reviewed and documented in the EPIC chart.   Directed ROS with pertinent positives and negatives documented in the history of present illness/assessment and plan.  Exam:  There were no vitals filed for this visit. General appearance:  Normal  Pelvic US today: T/V images.  Anteverted uterus with heterogenous echo pattern measuring 9.14 x 6.55 x 5.03 cm.  Area of fibroid or adenomyosis measuring 3.2 x 3.4 cm in the myometrium.  Status post left salpingo-oophorectomy.  Right ovary normal.  Right and left adnexa negative.  No free fluid in the posterior cul-de-sac.   Assessment/Plan:  47 y.o. [id number]   1. Menometrorrhagia Pelvic ultrasound findings reviewed thoroughly with patient.  Menometrorrhagia in October 2019.  No bleeding at all in November with mild vasomotor symptoms of menopause and a very thin endometrial line at 3.2 mm.  Patient reassured.  Probably entering menopause which will solve her bleeding problem.  Will observe at this time.  2. Perimenopause Mild vasomotor symptoms of menopause with no bleeding in November and a very thin endometrial line at 3.2 mm.  Will observe until next visit in 3 months.  Follow-up in 3 months for annual gynecologic exam.   Counseling on above issues and coordination of care more than 50% for 15  minutes.  Princess Bruins MD, 10:40 AM 06/01/2018

## 2018-06-01 NOTE — Patient Instructions (Signed)
1. Menometrorrhagia Pelvic ultrasound findings reviewed thoroughly with patient.  Menometrorrhagia in October 2019.  No bleeding at all in November with mild vasomotor symptoms of menopause and a very thin endometrial line at 3.2 mm.  Patient reassured.  Probably entering menopause which will solve her bleeding problem.  Will observe at this time.  2. Perimenopause Mild vasomotor symptoms of menopause with no bleeding in November and a very thin endometrial line at 3.2 mm.  Will observe until next visit in 3 months.  Follow-up in 3 months for annual gynecologic exam.   Palak, fue un placer verle hoy!

## 2018-08-17 ENCOUNTER — Other Ambulatory Visit: Payer: Self-pay | Admitting: Family Medicine

## 2018-08-17 NOTE — Telephone Encounter (Signed)
Requested Prescriptions  Pending Prescriptions Disp Refills  . gabapentin (NEURONTIN) 300 MG capsule [Pharmacy Med Name: GABAPENTIN 300MG  CAPSULES] 90 capsule 0    Sig: TAKE 1 CAPSULE(300 MG) BY MOUTH AT BEDTIME     Neurology: Anticonvulsants - gabapentin Passed - 08/17/2018 10:58 AM      Passed - Valid encounter within last 12 months    Recent Outpatient Visits          2 months ago DUB (dysfunctional uterine bleeding)   [facility], Lilia Argue, MD   5 months ago Essential hypertension, benign   [facility], Lilia Argue, MD   5 months ago Skin tag   [facility] at [facility], Gelene Mink, PA-C   6 months ago Annual physical exam   [facility], Lilia Argue, MD   7 months ago Poison ivy dermatitis   [facility], Lilia Argue, MD      Future Appointments            In 1 week Rutherford Guys, MD Primary Care at [place], [facility]   In 1 month Rutherford Guys, MD Primary Care at [place], [facility]

## 2018-08-24 ENCOUNTER — Ambulatory Visit (INDEPENDENT_AMBULATORY_CARE_PROVIDER_SITE_OTHER): Payer: BLUE CROSS/BLUE SHIELD | Admitting: Family Medicine

## 2018-08-24 ENCOUNTER — Encounter: Payer: Self-pay | Admitting: Family Medicine

## 2018-08-24 ENCOUNTER — Other Ambulatory Visit: Payer: Self-pay

## 2018-08-24 VITALS — BP 140/89 | HR 72 | Temp 98.6°F | Ht 61.0 in | Wt 158.4 lb

## 2018-08-24 DIAGNOSIS — I10 Essential (primary) hypertension: Secondary | ICD-10-CM | POA: Diagnosis not present

## 2018-08-24 DIAGNOSIS — Z639 Problem related to primary support group, unspecified: Secondary | ICD-10-CM | POA: Diagnosis not present

## 2018-08-24 DIAGNOSIS — R7303 Prediabetes: Secondary | ICD-10-CM

## 2018-08-24 MED ORDER — LISINOPRIL 20 MG PO TABS: 20.0000 mg | ORAL_TABLET | Freq: Every day | ORAL | 1 refills | Status: DC

## 2018-08-24 NOTE — Patient Instructions (Signed)
Terapistas que hablan espanol:  1. Andi Hence, [phone] 2. Consejeria Ruiz, Cataio 3. Floy Sabina, [phone]    Recuento de caloras para bajar de peso Calorie Counting for [employer] Loss Las caloras son unidades de Teacher, early years/pre. Su cuerpo necesita una cierta cantidad de caloras de los alimentos para que le ayuden a Company secretary. Cuando come ms caloras de las que el cuerpo necesita, este acumula las caloras extra [place]. Cuando come [employer] de las que el cuerpo [place], este quema grasa para obtener la energa que requiere. El recuento de caloras es el registro de la cantidad de caloras que come y Pharmacologist. El recuento de caloras puede ser de ayuda si necesita perder peso. Si se asegura de comer menos caloras de las que el cuerpo necesita, debe bajar de [place]. Pregntele al mdico cul es un peso sano para usted. Para que el recuento de caloras funcione, tendr que comer la cantidad de caloras adecuadas para usted en un Pruitt, para bajar una cantidad de peso saludable por semana. Un nutricionista puede determinar la cantidad de caloras que necesita por Pruitt y sugerirle cmo alcanzar su objetivo calrico.  Una cantidad de peso saludable para bajar por semana suele ser [place] 1 y Ivar Drape (0,5 a 0,9kg). Esto significa con frecuencia que su ingesta diaria de caloras se debera reducir unas 500 a 750caloras.  Ingerir 1200 a 1500caloras por Administrator, Civil Service a la [place] a Administrator, Civil Service.  Ingerir de 1500 a 1800caloras por Administrator, Civil Service a la [employer] de los hombres a Administrator, Civil Service. En qu consiste el plan? Mi objetivo es comer __________ [name]. Si como esta cantidad de caloras por Pruitt, debo bajar unas __________ Terrall Laity. Qu debo saber acerca del recuento de caloras? A fin de alcanzar su objetivo diario de caloras, tendr que:  Averiguar cuntas caloras hay en cada alimento que le Therapist, occupational.  Intente hacerlo antes de comer.  Decida la cantidad que puede comer del alimento.  Anote lo que comi y cuntas caloras tena. Esta tarea se conoce como llevar un registro de comidas. Para perder peso con xito es importante equilibrar el recuento de caloras con un estilo de vida saludable que incluya actividad fsica de forma regular. Tenga un objetivo de 141minutos de ejercicio moderado (como caminar) o 75 minutos de ejercicio vigoroso (como correr) todas las semanas. Dnde encuentro informacin sobre las caloras?  Es posible Animator cantidad de caloras que contiene un alimento en la etiqueta de informacin nutricional. Si un alimento no tiene una etiqueta de informacin nutricional, intente buscar las caloras en Internet o pida ayuda al nutricionista. Recuerde que las caloras se calculan por porcin. Si opta por comer ms de una porcin de un alimento, tendr [employer] las caloras por porcin por la cantidad de porciones que planea comer. Por ejemplo, la etiqueta de un envase de pan puede decir que el tamao de una porcin es [place], y que una porcin tiene 90caloras. Si come 1rodaja, habr comido 90caloras. Si come 2rodajas, habr comido 180caloras. Cmo llevo un registro de comidas? Despus de cada comida, registre la siguiente informacin en el registro de comidas:  Lo que comi. No olvide incluir los aderezos, las salsas y otros extras de la comida.  La cantidad que comi. Esto se puede medir en tazas, onzas o cantidad de alimentos.  Cuntas caloras ingiri por comida y por bebida.  La cantidad total de caloras en la comida. Tenga a FirstEnergy Corp  el registro de comidas, por ejemplo, en un anotador de bolsillo o utilice una aplicacin mvil o sitio web. Algunos programas calcularn las caloras y Automotive engineer la cantidad de caloras que le quedan para llegar al objetivo diario. Cules son algunos consejos para el recuento de caloras?   Use las caloras de los  alimentos y las bebidas que lo sacien y no lo dejen con apetito: ? Algunos ejemplos de alimentos que lo sacian son los frutos secos y Engineer, mining de frutos secos, verduras, Advertising account planner y Clinical research associate con alto contenido de Pharmacist, hospital como los cereales integrales. Los alimentos con alto contenido de [place] son aquellos que tienen ms de 5g de fibra por porcin. ? Las Xcel Energy refrescos, especialmente las bebidas a base de caf y los jugos, que contienen muchas caloras, pero no le dan saciedad.  Coma alimentos nutritivos y evite las caloras vacas. Las caloras vacas son aquellas que se obtienen de los alimentos o las bebidas que no contienen muchos nutrientes ni protenas, como los dulces y los refrescos. Es mejor comer una comida nutritiva altamente calrica (como un aguacate) que una con pocos nutrientes (como una bolsa de patatas fritas).  Sepa cuntas caloras tienen los alimentos que come con ms frecuencia. Esto le ayudar a contar las caloras ms rpidamente.  Preste atencin a las [employer] Data. Las bebidas de bajas caloras incluyen agua y refrescos sin Location manager.  Preste atencin a las etiquetas nutricionales de alimentos "bajos en grasas" o "sin grasas". Estos alimentos a veces tienen la misma cantidad de caloras o ms caloras que las versiones ricas en grasa. Con frecuencia, tambin tienen agregados de azcar, almidn o sal, para darles el sabor que fue eliminado con la grasa.  Encuentre un mtodo para controlar las caloras que funcione para usted. Sea creativo. Pruebe aplicaciones o programas distintos, si llevar un registro de las caloras no funciona para usted. Cules son algunos consejos para controlar las porciones?  Sepa cuntas caloras hay en una porcin. Esto lo ayudar a saber cuntas porciones de un alimento determinado puede comer.  Use una taza medidora para medir los tamaos de las porciones. Tambin Secondary school teacher las porciones en una balanza de  cocina. Con el tiempo, podr hacer un clculo estimativo de los tamaos de las porciones de algunos alimentos.  Dedique tiempo a poner porciones de diferentes alimentos en sus platos, tazones y tazas predilectos, a fin de saber cmo se ve una porcin.  Intente no comer directamente de una bolsa o una caja. Esto puede llevarlo a comer en exceso. Ponga la cantidad [employer] gustara comer en una taza o un plato, a fin de asegurarse de que est comiendo la porcin correcta.  Use platos, vasos y tazones ms pequeos para no comer en exceso.  Intente no realizar varias tareas al [employer] (como mirar la TV o usar su computadora) [place] come. Si es la hora de comer, sintese a Conservation officer, nature y disfrute de Environmental education officer. Esto lo ayudar a Marine scientist cundo est satisfecho. Tambin le ayudar a tomar conciencia de lo que est comiendo y de la cantidad. Cules son algunos consejos para seguir este plan? Lectura de las etiquetas de los alimentos  Controle el recuento de caloras en comparacin con el tamao de la porcin. El tamao de la porcin puede ser ms pequeo de lo que suele comer.  Verifique la fuente de las caloras. Asegrese de que la comida que ingiere tenga alto contenido de vitaminas y protenas y sea baja  en grasas saturadas y grasas trans. De compras  Lea las etiquetas nutricionales cuando compre. Esto le ayudar a tomar decisiones ms saludables antes de comprar [employer].  Haga una lista para el almacn y resptela. La coccin  Intente cocinar sus alimentos preferidos de una manera ms saludable. Por ejemplo, pruebe hornear en vez de frer.  Utilice productos lcteos descremados. Planificacin de los alimentos  Utilice ms frutas y verduras. La mitad de sus platos debe ser de frutas y verduras.  Incluya protenas Kerr[employer]McGee y el pescado. Cmo puedo hacer el recuento de caloras cuando como afuera?  Pida porciones ms pequeas.  Considere la posibilidad de Publishing rights manager un  plato principal y las guarniciones, en lugar de pedir su propio plato principal.  Si pide su propio plato principal, coma solo la mitad. Pida una caja al comienzo de la comida y ponga all el resto del plato principal, para no sentir la tentacin de comerlo.  Si se detallan las caloras en el men, elija las opciones que contengan la menor cantidad.  Elija platos que incluyan verduras, frutas, cereales integrales, productos lcteos con bajo contenido de grasa y Advertising account planner.  Opte por los alimentos hervidos, asados, cocidos a la parrilla o al vapor. No coma alimentos que contengan mantequilla, estn empanados, fritos o que se sirvan con salsa a base de crema. Generalmente, los alimentos que se etiquetan como "crujientes" estn fritos, a menos que se indique lo contrario.  Elija el agua, la [place], [place] t helado sin azcar u otras bebidas que no contengan azcares agregados. Si desea una bebida alcohlica, escoja una opcin con menos caloras como una copa de vino o una cerveza ligera.  Ordene los Kimberly[employer]ark, las salsas y los jarabes aparte. Estos son, con frecuencia, de alto contenido en caloras, por lo que debe limitar la cantidad que ingiere.  Si desea [place], elija una de hortalizas y pida carnes a la parrilla. Evite las guarniciones adicionales como el tocino, el queso o los alimentos fritos. Ordene el aderezo aparte o pida aceite de [place] y vinagre o limn para Haematologist.  Haga un clculo estimativo de la cantidad de porciones que le sirven. Por ejemplo, una porcin de arroz cocido equivale a media taza o la mitad del tamao de una pelota de bisbol. Conocer el tamao de las porciones lo ayudar a Personnel officer atento a la cantidad de comida que come Occidental Petroleum. La lista que sigue le [place] el tamao de algunas porciones comunes a partir de objetos cotidianos: ? 1onza (28g) = 4dados apilados. ? 3onzas (85g) = 41mazo de cartas. ? 1cucharadita = 1dado. ? 1cucharada =  media pelota de tenis de mesa. ? 2cucharadas = 1pelota de tenis de mesa. ? Media taza = media pelota de bisbol. ? 1taza = 1 pelota de bisbol. Resumen  El recuento de caloras es el registro de la cantidad de caloras que come y Pharmacologist. Si come menos caloras de las que el cuerpo necesita, debe bajar de [place].  Una cantidad de peso saludable para bajar por semana suele ser [place] 1 y Ivar Drape (0,5 a 0,9kg). Esto significa, con frecuencia, reducir su ingesta diaria de caloras unas 500 a 750 caloras.  Es posible Animator cantidad de caloras que contiene un alimento en la etiqueta de informacin nutricional. Si un alimento no tiene una etiqueta de informacin nutricional, intente buscar las caloras en Internet o pida ayuda al nutricionista.  Use las caloras de los alimentos y las bebidas  que lo sacien y no de los alimentos y las bebidas que lo dejan con apetito.  Use platos, vasos y tazones ms pequeos para no comer en exceso. Esta informacin no tiene Marine scientist el consejo del mdico. Asegrese de hacerle al mdico cualquier pregunta que tenga. Document Released: 10/08/2008 Document Revised: 09/21/2016 Document Reviewed: 09/21/2016 Elsevier Interactive Patient Education  2019 [employer].

## 2018-08-24 NOTE — Progress Notes (Signed)
2/19/202010:31 AM  [name] 26-Oct-1970, 48 y.o. female [id number]  Chief Complaint  Patient presents with  . Follow-up    still having some numbness in the right arm. going to a specialist for the arm. Being sent by her job  . Hypertension    HPI:   Patient is a 48 y.o. female with past medical history significant for HTN, DUB, asthma, h pylori who presents today for routine followup  Last OV Nov 2019 Has been seeing ortho for RIGHT lateral epicondylitis - workmens comp, she has been referred to PT Has seen gyn for DUB - peripmenopause, ctm  Patient reports that she is overall doing well She had not had any menstrual bleeding until Feb 1st, 7 days of mild bleeding Have some infrequent hot flashes  Having sign stressors with her husband Feels safe  Fall Risk  08/24/2018 05/24/2018 03/08/2018 02/25/2018 02/04/2018  Falls in the past year? 0 0 No No No  Number falls in past yr: 0 - - - -  Injury with Fall? 0 - - - -     Depression screen [facility] 2/9 08/24/2018 03/08/2018 02/25/2018  Decreased Interest 0 0 0  Down, Depressed, Hopeless 0 0 0  PHQ - 2 Score 0 0 0    Allergies  Allergen Reactions  . Fish Allergy Nausea And Vomiting    Prior to Admission medications   Medication Sig Start Date End Date Taking? Authorizing Provider  cetirizine (ZYRTEC ALLERGY) 10 MG tablet Take 10 mg by mouth daily as needed for allergies.     [provider]  lisinopril (PRINIVIL,ZESTRIL) 10 MG tablet Take 1 tablet (10 mg total) by mouth daily. 03/08/18   Rutherford Guys, MD  Omeprazole-Sodium Bicarbonate (ZEGERID) 20-1100 MG CAPS capsule Take 1 capsule by mouth daily before breakfast. As needed    [provider]    Past Medical History:  Diagnosis Date  . Adnexal mass    Left  . Allergy    pollen allergy  . Asthma    only in Spring- allergic to pollens  . Complication of anesthesia    had a lot of bleeding and blood clots from vagina after surgery in [place].  [place] over 15 years ago, had to be readmitted to hospital, no blood transfusion was necessary  . GERD (gastroesophageal reflux disease)   . Headache    occ  . Hypertension    Not taking any medication at this time, Was taking Lisinopril 10 mg but has not had any for 1 month ran out of prescription, didnt have a PCP until 12/2017  . Poison ivy 12/2017  . SUI (stress urinary incontinence, female)   . Vitamin D deficiency     Past Surgical History:  Procedure Laterality Date  . CESAREAN SECTION    . LAPAROSCOPIC TUBAL LIGATION Bilateral 08/22/2012   Procedure: LAPAROSCOPIC TUBAL LIGATION;  Surgeon: Woodroe Mode, MD;  Location: [facility];  Service: Gynecology;  Laterality: Bilateral;  IUD removal  . LIPOSUCTION TRUNK  2010  . OTHER SURGICAL HISTORY     Had a surgery in [place]. [place] unknown name,  . REDUCTION MAMMAPLASTY  2010  . ROBOTIC ASSISTED LAPAROSCOPIC LYSIS OF ADHESION  01/03/2018   Procedure: XI ROBOTIC ASSISTED LAPAROSCOPIC LYSIS OF ADHESIONS BETWEEN OMENTUM AND ABDOMINAL WALL;  Surgeon: Princess Bruins, MD;  Location: [facility];  Service: Gynecology;;    Social History   Tobacco Use  . Smoking status: Never Smoker  . Smokeless tobacco: Never Used  Substance Use Topics  . Alcohol use: No    Family History  Problem Relation Age of Onset  . Heart disease Father   . Diabetes Sister   . Hypertension Sister   . Hypertension Brother   . Liver disease Mother   . Breast cancer Maternal Aunt   . Colon cancer Neg Hx   . Esophageal cancer Neg Hx     Review of Systems  Constitutional: Negative for chills and fever.  Respiratory: Negative for cough and shortness of breath.   Cardiovascular: Negative for chest pain, palpitations and leg swelling.  Gastrointestinal: Negative for abdominal pain, nausea and vomiting.     OBJECTIVE:  Blood pressure 140/89, pulse 72, temperature 98.6 F (37 C), temperature source Oral, height 5\' 1"  (1.549 m), weight 158 lb 6.4 oz (71.8 kg),  last menstrual period 08/06/2018, SpO2 98 %. Body mass index is 29.93 kg/m.   Wt Readings from Last 3 Encounters:  08/24/18 158 lb 6.4 oz (71.8 kg)  05/24/18 157 lb (71.2 kg)  05/13/18 158 lb 6 oz (71.8 kg)   BP Readings from Last 3 Encounters:  08/24/18 140/89  05/24/18 135/86  05/13/18 126/74    Physical Exam Vitals signs and nursing note reviewed.  Constitutional:      Appearance: She is well-developed.  HENT:     Head: Normocephalic and atraumatic.  Eyes:     General: No scleral icterus.    Conjunctiva/sclera: Conjunctivae normal.     Pupils: Pupils are equal, round, and reactive to light.  Neck:     Musculoskeletal: Neck supple.  Pulmonary:     Effort: Pulmonary effort is normal.  Skin:    General: Skin is warm and dry.  Neurological:     Mental Status: She is alert and oriented to person, place, and time.     ASSESSMENT and PLAN  1. Essential hypertension, benign Barely at goal, increasing lisinopril. Discussed LFM - Comprehensive metabolic panel - Lipid panel  2. Pre-diabetes Discussed LFM - Hemoglobin A1c  3. Relationship problems Provided contact info for spanish speaking therapists  Other orders - lisinopril (PRINIVIL,ZESTRIL) 20 MG tablet; Take 1 tablet (20 mg total) by mouth daily.  Return in about 3 months (around 11/22/2018).    Rutherford Guys, MD Primary Care at [facility] [place], [place] [address] Ph.  [phone] Fax [phone]

## 2018-08-25 LAB — COMPREHENSIVE METABOLIC PANEL
ALT: 20 IU/L (ref 0–32)
AST: 23 IU/L (ref 0–40)
Albumin/Globulin Ratio: 1.3 (ref 1.2–2.2)
Albumin: 4.3 g/dL (ref 3.8–4.8)
Alkaline Phosphatase: 96 IU/L (ref 39–117)
BUN/Creatinine Ratio: 26 — ABNORMAL HIGH (ref 9–23)
BUN: 17 mg/dL (ref 6–24)
Bilirubin Total: <0.2 mg/dL (ref 0.0–1.2)
CO2: 24 mmol/L (ref 20–29)
Calcium: 9.4 mg/dL (ref 8.7–10.2)
Chloride: 102 mmol/L (ref 96–106)
Creatinine, Ser: 0.66 mg/dL (ref 0.57–1.00)
GFR calc Af Amer: 120 mL/min/{1.73_m2} (ref >59)
GFR calc non Af Amer: 104 mL/min/{1.73_m2} (ref >59)
Globulin, Total: 3.4 g/dL (ref 1.5–4.5)
Glucose: 79 mg/dL (ref 65–99)
Potassium: 4.7 mmol/L (ref 3.5–5.2)
Sodium: 139 mmol/L (ref 134–144)
Total Protein: 7.7 g/dL (ref 6.0–8.5)

## 2018-08-25 LAB — HEMOGLOBIN A1C
Est. average glucose Bld gHb Est-mCnc: 128 mg/dL
Hgb A1c MFr Bld: 6.1 % — ABNORMAL HIGH (ref 4.8–5.6)

## 2018-08-25 LAB — LIPID PANEL
Chol/HDL Ratio: 5.8 ratio — ABNORMAL HIGH (ref 0.0–4.4)
Cholesterol, Total: 216 mg/dL — ABNORMAL HIGH (ref 100–199)
HDL: 37 mg/dL — ABNORMAL LOW (ref >39)
LDL Calculated: 141 mg/dL — ABNORMAL HIGH (ref 0–99)
Triglycerides: 192 mg/dL — ABNORMAL HIGH (ref 0–149)
VLDL Cholesterol Cal: 38 mg/dL (ref 5–40)

## 2018-09-06 ENCOUNTER — Encounter: Payer: Self-pay | Admitting: Obstetrics & Gynecology

## 2018-09-06 ENCOUNTER — Ambulatory Visit (INDEPENDENT_AMBULATORY_CARE_PROVIDER_SITE_OTHER): Payer: BLUE CROSS/BLUE SHIELD | Admitting: Obstetrics & Gynecology

## 2018-09-06 VITALS — BP 124/82 | Ht 61.5 in | Wt 156.0 lb

## 2018-09-06 DIAGNOSIS — N951 Menopausal and female climacteric states: Secondary | ICD-10-CM | POA: Diagnosis not present

## 2018-09-06 DIAGNOSIS — Z9851 Tubal ligation status: Secondary | ICD-10-CM

## 2018-09-06 DIAGNOSIS — Z01419 Encounter for gynecological examination (general) (routine) without abnormal findings: Secondary | ICD-10-CM

## 2018-09-06 NOTE — Progress Notes (Signed)
[name] Nov 27, 1970 [id number]   History:    48 y.o. [id number] Married.  S/P Tubal Ligation.  RP:  Established patient presenting for annual gyn exam   HPI: Perimenopause with oligomenorrhea about every 3 months with hot flushes on-off.  No BTB.  No pelvic pain.  Rarely sexually active.  Breasts normal.  Urine and bowel movements normal.  Body mass index 29.  Health labs with family physician.  Past medical history,surgical history, family history and social history were all reviewed and documented in the EPIC chart.  Gynecologic History No LMP recorded. (Menstrual status: Irregular Periods). Contraception: tubal ligation Last Pap: 08/2017. Results were: Negative (TZ absent) Last mammogram: 09/2017. Results were: Negative Bone Density: Never Colonoscopy: Never  Obstetric History OB History  Gravida Para Term Preterm AB Living  4 3 3  0 1 3  SAB TAB Ectopic Multiple Live Births  0 1 0 0      # Outcome Date GA Lbr Len/2nd Weight Sex Delivery Anes PTL Lv  4 TAB           3 Term           2 Term           1 Term              ROS: A ROS was performed and pertinent positives and negatives are included in the history.  GENERAL: No fevers or chills. HEENT: No change in vision, no earache, sore throat or sinus congestion. NECK: No pain or stiffness. CARDIOVASCULAR: No chest pain or pressure. No palpitations. PULMONARY: No shortness of breath, cough or wheeze. GASTROINTESTINAL: No abdominal pain, nausea, vomiting or diarrhea, melena or bright red blood per rectum. GENITOURINARY: No urinary frequency, urgency, hesitancy or dysuria. MUSCULOSKELETAL: No joint or muscle pain, no back pain, no recent trauma. DERMATOLOGIC: No rash, no itching, no lesions. ENDOCRINE: No polyuria, polydipsia, no heat or cold intolerance. No recent change in weight. HEMATOLOGICAL: No anemia or easy bruising or bleeding. NEUROLOGIC: No headache, seizures, numbness, tingling or weakness. PSYCHIATRIC: No  depression, no loss of interest in normal activity or change in sleep pattern.     Exam:   BP 124/82   Ht 5' 1.5" (1.562 m)   Wt 156 lb (70.8 kg)   BMI 29.00 kg/m   Body mass index is 29 kg/m.  General appearance : Well developed well nourished female. No acute distress HEENT: Eyes: no retinal hemorrhage or exudates,  Neck supple, trachea midline, no carotid bruits, no thyroidmegaly Lungs: Clear to auscultation, no rhonchi or wheezes, or rib retractions  Heart: Regular rate and rhythm, no murmurs or gallops Breast:Examined in sitting and supine position were symmetrical in appearance, no palpable masses or tenderness,  no skin retraction, no nipple inversion, no nipple discharge, no skin discoloration, no axillary or supraclavicular lymphadenopathy Abdomen: no palpable masses or tenderness, no rebound or guarding Extremities: no edema or skin discoloration or tenderness  Pelvic: Vulva: Normal             Vagina: No gross lesions or discharge  Cervix: No gross lesions or discharge.  Pap reflex done.  Uterus  AV, normal size, shape and consistency, non-tender and mobile  Adnexa  Without masses or tenderness  Anus: Normal   Assessment/Plan:  48 y.o. female for annual exam   1. Encounter for routine gynecological examination with Papanicolaou smear of cervix Normal gynecologic exam.  Pap reflex done.  Breast exam normal.  Last mammogram March 2019  was negative.  Patient will schedule next screening mammogram now.  Health labs with family physician.  BMI 29.  Recommend lower calorie/carb diet such as [employer] and aerobic physical activities 5 times a week with weightlifting every 2 days.  2. Perimenopause Perimenopausal with menstrual periods every 1 to 3 months, normal flow and mild intermittent hot flashes and night sweats.  Counseling and precautions discussed with patient.  Will observe at this time.  3. S/P tubal ligation  Princess Bruins MD, 3:22 PM 09/06/2018

## 2018-09-07 LAB — PAP IG W/ RFLX HPV ASCU

## 2018-09-12 ENCOUNTER — Encounter: Payer: Self-pay | Admitting: Obstetrics & Gynecology

## 2018-09-12 NOTE — Patient Instructions (Signed)
1. Encounter for routine gynecological examination with Papanicolaou smear of cervix Normal gynecologic exam.  Pap reflex done.  Breast exam normal.  Last mammogram March 2019 was negative.  Patient will schedule next screening mammogram now.  Health labs with family physician.  BMI 29.  Recommend lower calorie/carb diet such as [employer] and aerobic physical activities 5 times a week with weightlifting every 2 days.  2. Perimenopause Perimenopausal with menstrual periods every 1 to 3 months, normal flow and mild intermittent hot flashes and night sweats.  Counseling and precautions discussed with patient.  Will observe at this time.  3. S/P tubal ligation  Parker, it was a pleasure seeing you today!  I will inform you of your results as soon as they are available.

## 2018-09-23 ENCOUNTER — Ambulatory Visit: Payer: BLUE CROSS/BLUE SHIELD | Admitting: Family Medicine

## 2018-10-20 ENCOUNTER — Other Ambulatory Visit: Payer: Self-pay | Admitting: Family Medicine

## 2018-10-20 ENCOUNTER — Telehealth: Payer: Self-pay | Admitting: Family Medicine

## 2018-10-20 NOTE — Telephone Encounter (Signed)
Copied from [place] [phone]. Topic: Quick Communication - See Telephone Encounter >> Oct 20, 2018 11:47 AM Sheran Luz wrote: CRM for notification. See Telephone encounter for: 10/20/18.  Medication: lisinopril (PRINIVIL,ZESTRIL) 10 MG tablet   Pharmacist with [facility] calling to get clarification. She states patient picked up 20mg  of this medication in February and wanted to clarify which patient is actually supposed to take.

## 2018-10-21 NOTE — Telephone Encounter (Signed)
Was she suppose to take 10 or 20 mg tablet for B/P? Please advise, she has been on the 20 mg tablet since her last OV.

## 2018-10-24 MED ORDER — LISINOPRIL 20 MG PO TABS: 20.0000 mg | ORAL_TABLET | Freq: Every day | ORAL | 1 refills | Status: DC

## 2018-11-23 ENCOUNTER — Other Ambulatory Visit: Payer: Self-pay

## 2018-11-23 ENCOUNTER — Telehealth: Payer: BLUE CROSS/BLUE SHIELD | Admitting: Family Medicine

## 2019-01-23 ENCOUNTER — Encounter: Payer: Self-pay | Admitting: Family Medicine

## 2019-01-23 ENCOUNTER — Ambulatory Visit (INDEPENDENT_AMBULATORY_CARE_PROVIDER_SITE_OTHER): Payer: BC Managed Care – PPO | Admitting: Family Medicine

## 2019-01-23 ENCOUNTER — Other Ambulatory Visit: Payer: Self-pay

## 2019-01-23 ENCOUNTER — Ambulatory Visit (INDEPENDENT_AMBULATORY_CARE_PROVIDER_SITE_OTHER): Payer: BC Managed Care – PPO

## 2019-01-23 VITALS — BP 140/89 | HR 79 | Temp 98.5°F | Ht 61.5 in | Wt 162.0 lb

## 2019-01-23 DIAGNOSIS — I10 Essential (primary) hypertension: Secondary | ICD-10-CM

## 2019-01-23 DIAGNOSIS — M5442 Lumbago with sciatica, left side: Secondary | ICD-10-CM

## 2019-01-23 DIAGNOSIS — M5441 Lumbago with sciatica, right side: Secondary | ICD-10-CM

## 2019-01-23 DIAGNOSIS — G8929 Other chronic pain: Secondary | ICD-10-CM

## 2019-01-23 DIAGNOSIS — R6881 Early satiety: Secondary | ICD-10-CM

## 2019-01-23 DIAGNOSIS — K219 Gastro-esophageal reflux disease without esophagitis: Secondary | ICD-10-CM

## 2019-01-23 DIAGNOSIS — K5900 Constipation, unspecified: Secondary | ICD-10-CM

## 2019-01-23 DIAGNOSIS — R7303 Prediabetes: Secondary | ICD-10-CM

## 2019-01-23 MED ORDER — OMEPRAZOLE 20 MG PO CPDR: 20.0000 mg | DELAYED_RELEASE_CAPSULE | Freq: Two times a day (BID) | ORAL | 3 refills | Status: DC

## 2019-01-23 MED ORDER — POLYETHYLENE GLYCOL 3350 17 GM/SCOOP PO POWD: 17.0000 g | Freq: Two times a day (BID) | ORAL | 1 refills | Status: DC | PRN

## 2019-01-23 NOTE — Progress Notes (Signed)
7/20/20204:14 PM  [name] 27-Jan-1971, 48 y.o., female [id number]  Chief Complaint  Patient presents with  . GI Problem     pt went to gastro, did testing there for the stomach pain she has, pos h pylori however pt never went bk to follow up for results. She is unaware of this pos result of 8 months ago. Taking prilosec daily    HPI:   Patient is a 48 y.o. female with past medical history significant for HTN, prediabetes, DUB, asthma, h pylori who presents today for routine followup  Last OV Feb 2020 - increased lisinopril Treated for h pylori Sept 2019, IgG positive in Nov 2019 and started PPI daily  GERD well controlled on daily PPI She continues to have postparandial bloating and early satiety Last night had dinner around 7pm, went to bed at 10pm, woke up around 1pm due to severe abd pain, felt like is she still had not digested her dinner Treatment of h pylori did not affect her symptoms She denies any diarrhea She does have mild constipation Vomits rarely, but does vomit undigested food  Also having low back pain Worse with bending, pulling Feels at times numbness and weakness in her legs   Lab Results  Component Value Date   HGBA1C 6.1 (H) 08/24/2018   Lab Results  Component Value Date   CREATININE 0.66 08/24/2018   BUN 17 08/24/2018   NA 139 08/24/2018   K 4.7 08/24/2018   CL 102 08/24/2018   CO2 24 08/24/2018   Lab Results  Component Value Date   CHOL 216 (H) 08/24/2018   HDL 37 (L) 08/24/2018   LDLCALC 141 (H) 08/24/2018   TRIG 192 (H) 08/24/2018   CHOLHDL 5.8 (H) 08/24/2018    Depression screen PHQ 2/9 01/23/2019 08/24/2018 03/08/2018  Decreased Interest 0 0 0  Down, Depressed, Hopeless 0 0 0  PHQ - 2 Score 0 0 0    Fall Risk  01/23/2019 08/24/2018 05/24/2018 03/08/2018 02/25/2018  Falls in the past year? 0 0 0 No No  Number falls in past yr: 0 0 - - -  Injury with Fall? 0 0 - - -     Allergies  Allergen Reactions  . Fish Allergy  Nausea And Vomiting    Prior to Admission medications   Medication Sig Start Date End Date Taking? Authorizing Provider  cetirizine (ZYRTEC ALLERGY) 10 MG tablet Take 10 mg by mouth daily as needed for allergies.    Yes [provider]  lisinopril (ZESTRIL) 20 MG tablet Take 1 tablet (20 mg total) by mouth daily. 10/24/18  Yes Rutherford Guys, MD  Omeprazole-Sodium Bicarbonate (ZEGERID) 20-1100 MG CAPS capsule Take 1 capsule by mouth daily before breakfast. As needed   Yes [provider]    Past Medical History:  Diagnosis Date  . Adnexal mass    Left  . Allergy    pollen allergy  . Asthma    only in Spring- allergic to pollens  . Complication of anesthesia    had a lot of bleeding and blood clots from vagina after surgery in [place]. [place] over 15 years ago, had to be readmitted to hospital, no blood transfusion was necessary  . GERD (gastroesophageal reflux disease)   . Headache    occ  . Hypertension    Not taking any medication at this time, Was taking Lisinopril 10 mg but has not had any for 1 month ran out of prescription, didnt have a PCP  until 12/2017  . Poison ivy 12/2017  . SUI (stress urinary incontinence, female)   . Vitamin D deficiency     Past Surgical History:  Procedure Laterality Date  . CESAREAN SECTION    . LAPAROSCOPIC TUBAL LIGATION Bilateral 08/22/2012   Procedure: LAPAROSCOPIC TUBAL LIGATION;  Surgeon: Woodroe Mode, MD;  Location: [facility];  Service: Gynecology;  Laterality: Bilateral;  IUD removal  . LIPOSUCTION TRUNK  2010  . OTHER SURGICAL HISTORY     Had a surgery in [place]. [place] unknown name,  . REDUCTION MAMMAPLASTY  2010  . ROBOTIC ASSISTED LAPAROSCOPIC LYSIS OF ADHESION  01/03/2018   Procedure: XI ROBOTIC ASSISTED LAPAROSCOPIC LYSIS OF ADHESIONS BETWEEN OMENTUM AND ABDOMINAL WALL;  Surgeon: Princess Bruins, MD;  Location: [facility];  Service: Gynecology;;    Social History   Tobacco Use  . Smoking status: Never Smoker  .  Smokeless tobacco: Never Used  Substance Use Topics  . Alcohol use: No    Family History  Problem Relation Age of Onset  . Heart disease Father   . Diabetes Sister   . Hypertension Sister   . Hypertension Brother   . Liver disease Mother   . Breast cancer Maternal Aunt   . Colon cancer Neg Hx   . Esophageal cancer Neg Hx     ROS Per hpi  OBJECTIVE:  Today's Vitals   01/23/19 1554  BP: 140/89  Pulse: 79  Temp: 98.5 F (36.9 C)  TempSrc: Oral  SpO2: 98%  Weight: 162 lb (73.5 kg)  Height: 5' 1.5" (1.562 m)   Body mass index is 30.11 kg/m.  Wt Readings from Last 3 Encounters:  01/23/19 162 lb (73.5 kg)  09/06/18 156 lb (70.8 kg)  08/24/18 158 lb 6.4 oz (71.8 kg)   Physical Exam Vitals signs and nursing note reviewed.  Constitutional:      Appearance: She is well-developed.  HENT:     Head: Normocephalic and atraumatic.     Mouth/Throat:     Pharynx: No oropharyngeal exudate.  Eyes:     General: No scleral icterus.    Conjunctiva/sclera: Conjunctivae normal.     Pupils: Pupils are equal, round, and reactive to light.  Neck:     Musculoskeletal: Neck supple.  Cardiovascular:     Rate and Rhythm: Normal rate and regular rhythm.     Heart sounds: Normal heart sounds. No murmur. No friction rub. No gallop.   Pulmonary:     Effort: Pulmonary effort is normal.     Breath sounds: Normal breath sounds. No wheezing or rales.  Abdominal:     General: Bowel sounds are normal.     Palpations: Abdomen is soft. There is no hepatomegaly, splenomegaly or mass.     Tenderness: There is abdominal tenderness in the right upper quadrant and epigastric area. There is no guarding or rebound. Negative signs include Murphy's sign.  Musculoskeletal:     Lumbar back: She exhibits tenderness (midlinel ). She exhibits normal range of motion and no spasm.     Comments: + 2 BLE DTRs Neg SLR Minimal TTP over piriformis  Skin:    General: Skin is warm and dry.  Neurological:      Mental Status: She is alert and oriented to person, place, and time.     ASSESSMENT and PLAN  1. Gastroesophageal reflux disease, esophagitis presence not specified Increase PPI, discussed LFM. Referring to GI - Ambulatory referral to Gastroenterology  2. Early satiety - Ambulatory referral to Gastroenterology  3. Chronic midline low back pain with bilateral sciatica - DG Lumbar Spine Complete; Future - patient left wo xray Discussed conservative measures with OTC analgesics and gentle stretching  4. Constipation, unspecified constipation type Trial of miralax  5. Pre-diabetes - Hemoglobin A1c - Comprehensive metabolic panel  6. Essential hypertension, benign Controlled. Continue current regime.   Other orders - omeprazole (PRILOSEC) 20 MG capsule; Take 1 capsule (20 mg total) by mouth 2 (two) times daily before a meal. - polyethylene glycol powder (GLYCOLAX/MIRALAX) 17 GM/SCOOP powder; Take 17 g by mouth 2 (two) times daily as needed.  Return in about 6 weeks (around 03/06/2019).    Rutherford Guys, MD Primary Care at [place], [place] [address] Ph.  [phone] Fax [phone]

## 2019-01-23 NOTE — Patient Instructions (Signed)
Prediabetes  Prediabetes  La prediabetes es la afección de tener un nivel de azúcar en la sangre (glucemia más alto de lo normal, aunque no lo suficientemente alto para recibir un diagnóstico de diabetes tipo 2. El hecho de ser prediabético lo pone en riesgo de desarrollar diabetes tipo 2 (diabetes mellitus tipo 2). La prediabetes puede denominarse intolerancia a la glucosa o alteración de la glucosa en ayunas.  Generalmente, la prediabetes no causa síntomas. El médico puede diagnosticar esta afección por los análisis de sangre. Es posible que le realicen un análisis para determinar si tiene prediabetes si tiene sobrepeso y al menos algún otro factor de riesgo de prediabetes.  ¿Qué es la glucemia y cómo se mide?  La glucemia es la cantidad de glucosa presente en el torrente sanguíneo. La glucosa proviene de los alimentos que ingiere que contienen azúcares y almidones (hidratos de carbono), que el cuerpo descompone en glucosa. El nivel de glucemia se puede medir en mg/dl (miligramos por decilitro) o mmol/l (milimoles por litro). La glucemia puede comprobarse con uno o más de los siguientes análisis de sangre:  · Prueba de la glucemia en ayunas. No se le permitirá comer (tendrá que hacer ayuno) durante 8 horas o más antes de que se le tome una muestra de sangre.  ? El rango normal de glucemia en ayunas es de 70 a 100 mg/dl (3,9 a 5,6 mmol/l).  · Una prueba de sangre de A1c (hemoglobina A1c). Esta prueba proporciona información sobre el control de la glucemia durante los últimos 2 o 3 meses.  · Prueba de tolerancia a la glucosa oral (PTGO). Esta prueba mide la glucemia en dos momentos:  ? Después de ayunar. Este es el valor inicial.  ? Dos horas después de tomar una bebida que contiene glucosa.  Pueden diagnosticarle prediabetes en los siguientes casos:  · Si su glucemia en ayunas es de 100 a 125 mg/dl (5,6 a 6,9 mmol/l).  · Si su nivel de A1c es de 5,7 a 6,4 %.  · Si su resultado de la PTGO es de 140 a 199 mg/dl (7,8  a 11 mmol/l).  Estas pruebas de sangre se pueden repetir para confirmar el diagnóstico.  ¿Cómo puede afectarme esta enfermedad?  El páncreas produce una hormona (insulina) que ayuda a mover la glucosa del torrente sanguíneo al interior de las células. Cuando las células del cuerpo no responden adecuadamente a la insulina que el cuerpo produce (resistencia a la insulina), se acumula exceso de glucosa en la sangre en lugar de entrar en las células. Como resultado, se puede producir un nivel alto de glucemia (hiperglucemia), lo cual puede causar muchas complicaciones. La hiperglucemia es un síntoma de prediabetes.  Tener la glucemia alta durante mucho tiempo es peligroso. Demasiada glucosa en la sangre puede dañar los nervios y los vasos sanguíneos. El daño prolongado puede derivar en complicaciones de la diabetes, que pueden incluir:  · Enfermedad cardíaca.  · Accidente cerebrovascular.  · Ceguera.  · Enfermedad renal.  · Depresión.  · Circulación deficiente en los pies y las piernas, lo cual podría ocasionar una extirpación quirúrgica (amputación) en casos graves.  ¿Qué puede aumentar el riesgo?  Algunos de los factores de riesgo de prediabetes son los siguientes:  · Tener un familiar con diabetes tipo 2.  · Tener exceso de peso u obesidad.  · Ser mayor de 45 años de edad.  · Ser descendiente de indígenas norteamericanos, afroamericanos, hispanos o latinos, o asiáticos o isleños del Pacífico.  · Tener un estilo de vida inactivo (sedentario).  · Tener antecedentes de enfermedades   Tener presin arterial alta. Qu puedo hacer para prevenir la diabetes?      Haga actividad fsica. ? Haga actividad fsica de intensidad moderada durante 18minutos o ms 5das por semana o con la frecuencia  que le indique su mdico. Esto podra incluir caminatas dinmicas, ciclismo o gimnasia acutica. ? Pregntele al mdico qu actividades son seguras para usted. Una combinacin de actividades puede ser la mejor opcin, por ejemplo, caminar, practicar natacin, andar en bicicleta y hacer entrenamiento de fuerza.  Baje de [employer] se lo haya indicado el mdico. ? Bajar entre el 5% y el 7% del peso corporal puede revertir la resistencia a la insulina. ? El mdico puede determinar cunto peso tiene que perder y Hotel manager a que adelgace de Geographical information systems officer segura.  Siga un plan de alimentacin saludable. Este incluye consumir protenas magras, hidratos de carbono complejos, frutas y verduras frescas, productos lcteos con bajo contenido de grasa y grasas saludables. ? Siga las indicaciones del mdico respecto de las restricciones de comidas o bebidas. ? Programe una cita con un especialista en alimentacin y nutricin (nutricionista certificado) para que lo ayude a Probation officer plan de alimentacin saludable adecuado para usted.  No fume ni consuma ningn producto que contenga tabaco, lo que incluye cigarrillos, tabaco de Higher education careers adviser y Psychologist, sport and exercise. Si necesita ayuda para dejar de fumar, consulte al [employer].  [employer] recetados y de venta libre como se lo haya indicado el mdico. Posiblemente le receten medicamentos para ayudarlo a reducir el riesgo de tener diabetes tipo2.  Concurra a todas las visitas de seguimiento como se lo haya indicado el mdico. Esto es importante. Resumen  La prediabetes es la afeccin de tener un nivel de azcar en la sangre (glucemia ms alto de lo normal, aunque no lo suficientemente alto para recibir un diagnstico de diabetes tipo2.  El hecho de ser prediabtico lo pone en riesgo de desarrollar diabetes tipo 2 (diabetes mellitus tipo2).  Para ayudar a prevenir la diabetes tipo2, haga cambios en el estilo de vida, como realizar actividad fsica y comer  alimentos saludables. Baje de [employer] se lo haya indicado el mdico. Esta informacin no tiene Marine scientist el consejo del mdico. Asegrese de hacerle al mdico cualquier pregunta que tenga. Document Released: 08/13/2015 Document Revised: 09/20/2017 Document Reviewed: 08/13/2015 Elsevier Patient Education  2020 [place] de alimentos para pacientes adultos con enfermedad de reflujo gastroesofgico Food Choices for Gastroesophageal Reflux Disease, Adult Si tiene enfermedad de reflujo gastroesofgico (ERGE), los alimentos que consume y los hbitos de alimentacin son Theatre stage manager. Elegir los alimentos adecuados puede ayudar a Public house manager las molestias ocasionadas por la [employer]. Considere la posibilidad de trabajar con un especialista en dieta y nutricin (nutricionista) que lo ayude a Adult nurse saludables. Qu pautas generales debo seguir?  Plan de alimentacin  Elija alimentos saludables con bajo contenido de grasa, como frutas, verduras, cereales integrales, productos lcteos descremados, carne magra de vaca, de pescado y de ave.  Haga comidas pequeas con frecuencia en lugar de tres comidas abundantes al da. Coma lentamente, en un ambiente distendido. Evite agacharse o recostarse hasta despus de 2 o 3horas de haber comido.  Limite los alimentos con alto contenido graso como las carnes grasas o los alimentos fritos.  Limite el consumo de [employer], [place] y [place] a menos de 8 cucharaditas al Training and development officer.  Evite lo siguiente: ? Consumir alimentos que le ocasionen sntomas. Pueden ser distintos para cada persona. Lleve un registro de los alimentos para identificar  aquellos que le ocasionen sntomas. ? Consumir alcohol. ? Beber grandes cantidades de lquido con las comidas. ? Comer 2 o 3 horas antes de acostarse.  Cocine los alimentos utilizando mtodos que no sean la fritura. Esto puede Ship broker, Marine scientist y hervir. Estilo de vida  Mantenga un peso  saludable. Pregunte a su mdico cul es el peso saludable para usted. Si debe perder peso, hable con su mdico para hacerlo de manera segura.  Realice actividad fsica durante, al menos, 30 minutos 5 das por semana o ms, o segn lo indicado por su mdico.  Evite usar ropa ajustada alrededor de la cintura y Management consultant.  No consuma ningn producto que contenga nicotina o tabaco, como cigarrillos y Psychologist, sport and exercise. Si necesita ayuda para dejar de fumar, consulte al mdico.  Duerma con la cabecera de la cama elevada. Use una cua debajo del colchn o bloques debajo del armazn de la cama para Theatre manager la cabecera de la cama elevada. Qu alimentos no se recomiendan? Esta podra no ser [employer]. Hable con el nutricionista sobre las mejores opciones alimenticias para usted. Carbohidratos Pasteles o panes sin levadura con grasa agregada. Tostadas francesas. Verduras Verduras fritas en abundante aceite. Papas fritas. Cualquier verdura que est preparada con grasa agregada. Cualquier verdura que le ocasione sntomas. Para algunas personas, estas pueden incluir tomates y productos con tomate, [place], cebollas y [place], y rbanos picantes. Frutas Cualquier fruta que est preparada con grasa agregada. Cualquier fruta que le ocasione sntomas. Para algunas personas, estas pueden incluir, las frutas ctricas como naranja, pomelo, pia y limn. Carnes y otros alimentos ricos en protenas Carnes de alto contenido graso como carne grasa de vaca o cerdo, salchichas, costillas, [place], salchicha, salame y tocino. Carnes o protenas fritas, lo que incluye pescado frito y pollo frito. Nueces y [place] de frutos secos. Lcteos Leche entera y [place] con chocolate. [employer]. Crema. Helados. Queso crema. Batidos con [employer]. Bebidas Caf y t negro, con o sin cafena. Bebidas carbonatadas. Refrescos. Bebidas energizantes. Jugo de fruta hecho con frutas cidas (como naranja o pomelo). Jugo de tomate.  Bebidas alcohlicas. Grasas y [employer]. Margarina. Lardo. Mantequilla clarificada. Dulces y postres Chocolate y cacao. Rosquillas. Condimentos y otros alimentos [place]. Menta y mentol. Cualquier condimento, hierbas o aderezos que le ocasionen sntomas. Para algunas personas, esto puede incluir curry, salsa picante o aderezos para ensalada a base de vinagre. Resumen  Si tiene enfermedad de reflujo gastroesofgico (ERGE), las elecciones de alimentos y [place] de vida son muy importantes para ayudar a Public house manager las molestias de la [place].  Haga comidas pequeas con frecuencia en lugar de tres comidas abundantes al da. Coma lentamente, en un ambiente distendido. Evite agacharse o recostarse hasta despus de 2 o 3horas de haber comido.  Limite los alimentos con alto contenido graso como la carne grasa o los alimentos fritos. Esta informacin no tiene Marine scientist el consejo del mdico. Asegrese de hacerle al mdico cualquier pregunta que tenga. Document Released: 04/01/2005 Document Revised: 10/12/2016 Document Reviewed: 04/26/2013 Elsevier Patient Education  2020 [employer].

## 2019-01-24 LAB — COMPREHENSIVE METABOLIC PANEL
ALT: 18 IU/L (ref 0–32)
AST: 19 IU/L (ref 0–40)
Albumin/Globulin Ratio: 1.3 (ref 1.2–2.2)
Albumin: 4.1 g/dL (ref 3.8–4.8)
Alkaline Phosphatase: 96 IU/L (ref 39–117)
BUN/Creatinine Ratio: 15 (ref 9–23)
BUN: 13 mg/dL (ref 6–24)
Bilirubin Total: <0.2 mg/dL (ref 0.0–1.2)
CO2: 22 mmol/L (ref 20–29)
Calcium: 9.1 mg/dL (ref 8.7–10.2)
Chloride: 101 mmol/L (ref 96–106)
Creatinine, Ser: 0.84 mg/dL (ref 0.57–1.00)
GFR calc Af Amer: 94 mL/min/{1.73_m2} (ref >59)
GFR calc non Af Amer: 82 mL/min/{1.73_m2} (ref >59)
Globulin, Total: 3.2 g/dL (ref 1.5–4.5)
Glucose: 83 mg/dL (ref 65–99)
Potassium: 4.1 mmol/L (ref 3.5–5.2)
Sodium: 138 mmol/L (ref 134–144)
Total Protein: 7.3 g/dL (ref 6.0–8.5)

## 2019-01-24 LAB — HEMOGLOBIN A1C
Est. average glucose Bld gHb Est-mCnc: 126 mg/dL
Hgb A1c MFr Bld: 6 % — ABNORMAL HIGH (ref 4.8–5.6)

## 2019-02-01 ENCOUNTER — Telehealth: Payer: Self-pay | Admitting: Family Medicine

## 2019-02-01 NOTE — Telephone Encounter (Signed)
Copied from [place] [id number]. Topic: Quick Communication - See Telephone Encounter >> Feb 01, 2019  3:20 PM Nils Flack wrote: CRM for notification. See Telephone encounter for: 02/01/19. Pt says that [facility] told her that they sent a note about one of the meds that was prescribed on 01/23/19.  Pt does not know the name of the med and was driving.  She is asking for [place] to call the pharmacy to investigate further

## 2019-02-01 NOTE — Telephone Encounter (Signed)
I have called the pt and she stated that she is only able to get one medication. I have called the pharmacy and clarified how much marlax rx. Pharmacy is now getting it ready for pt.

## 2019-03-03 ENCOUNTER — Ambulatory Visit: Payer: BLUE CROSS/BLUE SHIELD | Admitting: Gastroenterology

## 2019-03-06 ENCOUNTER — Encounter: Payer: Self-pay | Admitting: Family Medicine

## 2019-03-06 ENCOUNTER — Other Ambulatory Visit: Payer: Self-pay

## 2019-03-06 ENCOUNTER — Ambulatory Visit (INDEPENDENT_AMBULATORY_CARE_PROVIDER_SITE_OTHER): Payer: BC Managed Care – PPO | Admitting: Family Medicine

## 2019-03-06 VITALS — BP 125/85 | HR 78 | Temp 98.7°F | Ht 61.5 in | Wt 163.8 lb

## 2019-03-06 DIAGNOSIS — I10 Essential (primary) hypertension: Secondary | ICD-10-CM | POA: Diagnosis not present

## 2019-03-06 DIAGNOSIS — G8929 Other chronic pain: Secondary | ICD-10-CM

## 2019-03-06 DIAGNOSIS — M5442 Lumbago with sciatica, left side: Secondary | ICD-10-CM | POA: Diagnosis not present

## 2019-03-06 DIAGNOSIS — K5900 Constipation, unspecified: Secondary | ICD-10-CM

## 2019-03-06 DIAGNOSIS — M5441 Lumbago with sciatica, right side: Secondary | ICD-10-CM

## 2019-03-06 MED ORDER — LISINOPRIL 20 MG PO TABS: 20.0000 mg | ORAL_TABLET | Freq: Every day | ORAL | 1 refills | Status: DC

## 2019-03-06 MED ORDER — OMEPRAZOLE 20 MG PO CPDR: 20.0000 mg | DELAYED_RELEASE_CAPSULE | Freq: Two times a day (BID) | ORAL | 3 refills | Status: AC

## 2019-03-06 MED ORDER — MELOXICAM 15 MG PO TABS: 15.0000 mg | ORAL_TABLET | Freq: Every day | ORAL | 2 refills | Status: DC

## 2019-03-06 NOTE — Progress Notes (Signed)
8/31/20202:17 PM  [name] 1971/02/17, 48 y.o., female [id number]  Chief Complaint  Patient presents with  . Hypertension  . Medication Refill    lisinopril    HPI:   Patient is a 48 y.o. female with past medical history significant for HTN, prediabetes, DUB, asthma, GERD, h pylori who presents today for routine followup  Last OV July 2020 Increased PPI and referred to GI Trial of miralax GI missed appt, thought it was this friday GI sx better with increased PPI miralax not helping with constipation, she goes about every 2-3 days, very hard stools MOM works well for her  Back pain, low, all day, worse when she wakes up Worse after heavy house cleaning or carrying heavy objects Sometimes goes down her legs to her feet with numbness Xray: mod- severe DDD L5-S1  Declines flu vaccine  Lab Results  Component Value Date   HGBA1C 6.0 (H) 01/23/2019   Lab Results  Component Value Date   CREATININE 0.84 01/23/2019   BUN 13 01/23/2019   NA 138 01/23/2019   K 4.1 01/23/2019   CL 101 01/23/2019   CO2 22 01/23/2019     Depression screen PHQ 2/9 03/06/2019 01/23/2019 08/24/2018  Decreased Interest 0 0 0  Down, Depressed, Hopeless 0 0 0  PHQ - 2 Score 0 0 0    Fall Risk  03/06/2019 01/23/2019 08/24/2018 05/24/2018 03/08/2018  Falls in the past year? 0 0 0 0 No  Number falls in past yr: 0 0 0 - -  Injury with Fall? 0 0 0 - -     Allergies  Allergen Reactions  . Fish Allergy Nausea And Vomiting    Prior to Admission medications   Medication Sig Start Date End Date Taking? Authorizing Provider  cetirizine (ZYRTEC ALLERGY) 10 MG tablet Take 10 mg by mouth daily as needed for allergies.    Yes [provider]  lisinopril (ZESTRIL) 20 MG tablet Take 1 tablet (20 mg total) by mouth daily. 10/24/18  Yes Rutherford Guys, MD  omeprazole (PRILOSEC) 20 MG capsule Take 1 capsule (20 mg total) by mouth 2 (two) times daily before a meal. 01/23/19  Yes Rutherford Guys, MD  polyethylene glycol powder (GLYCOLAX/MIRALAX) 17 GM/SCOOP powder Take 17 g by mouth 2 (two) times daily as needed. 01/23/19  Yes Rutherford Guys, MD    Past Medical History:  Diagnosis Date  . Adnexal mass    Left  . Allergy    pollen allergy  . Asthma    only in Spring- allergic to pollens  . Complication of anesthesia    had a lot of bleeding and blood clots from vagina after surgery in [place]. [place] over 15 years ago, had to be readmitted to hospital, no blood transfusion was necessary  . GERD (gastroesophageal reflux disease)   . Headache    occ  . Hypertension    Not taking any medication at this time, Was taking Lisinopril 10 mg but has not had any for 1 month ran out of prescription, didnt have a PCP until 12/2017  . Poison ivy 12/2017  . SUI (stress urinary incontinence, female)   . Vitamin D deficiency     Past Surgical History:  Procedure Laterality Date  . CESAREAN SECTION    . LAPAROSCOPIC TUBAL LIGATION Bilateral 08/22/2012   Procedure: LAPAROSCOPIC TUBAL LIGATION;  Surgeon: Woodroe Mode, MD;  Location: [facility];  Service: Gynecology;  Laterality: Bilateral;  IUD removal  .  LIPOSUCTION TRUNK  2010  . OTHER SURGICAL HISTORY     Had a surgery in [place]. [place] unknown name,  . REDUCTION MAMMAPLASTY  2010  . ROBOTIC ASSISTED LAPAROSCOPIC LYSIS OF ADHESION  01/03/2018   Procedure: XI ROBOTIC ASSISTED LAPAROSCOPIC LYSIS OF ADHESIONS BETWEEN OMENTUM AND ABDOMINAL WALL;  Surgeon: Princess Bruins, MD;  Location: [facility];  Service: Gynecology;;    Social History   Tobacco Use  . Smoking status: Never Smoker  . Smokeless tobacco: Never Used  Substance Use Topics  . Alcohol use: No    Family History  Problem Relation Age of Onset  . Heart disease Father   . Diabetes Sister   . Hypertension Sister   . Hypertension Brother   . Liver disease Mother   . Breast cancer Maternal Aunt   . Colon cancer Neg Hx   . Esophageal cancer Neg Hx     Review of  Systems  Constitutional: Negative for chills and fever.  Respiratory: Negative for cough and shortness of breath.   Cardiovascular: Negative for chest pain, palpitations and leg swelling.  Gastrointestinal: Negative for abdominal pain, nausea and vomiting.     OBJECTIVE:  Today's Vitals   03/06/19 1414  BP: 125/85  Pulse: 78  Temp: 98.7 F (37.1 C)  TempSrc: Oral  SpO2: 100%  Weight: 163 lb 12.8 oz (74.3 kg)  Height: 5' 1.5" (1.562 m)   Body mass index is 30.45 kg/m.   Physical Exam Vitals signs and nursing note reviewed.  Constitutional:      Appearance: She is well-developed.  HENT:     Head: Normocephalic and atraumatic.     Mouth/Throat:     Pharynx: No oropharyngeal exudate.  Eyes:     General: No scleral icterus.    Conjunctiva/sclera: Conjunctivae normal.     Pupils: Pupils are equal, round, and reactive to light.  Neck:     Musculoskeletal: Neck supple.  Cardiovascular:     Rate and Rhythm: Normal rate and regular rhythm.     Heart sounds: Normal heart sounds. No murmur. No friction rub. No gallop.   Pulmonary:     Effort: Pulmonary effort is normal.     Breath sounds: Normal breath sounds. No wheezing or rales.  Skin:    General: Skin is warm and dry.  Neurological:     Mental Status: She is alert and oriented to person, place, and time.     No results found for this or any previous visit (from the past 24 hour(s)).  No results found.   ASSESSMENT and PLAN  1. Essential hypertension, benign Controlled. Continue current regime.   2. Chronic midline low back pain with bilateral sciatica Trial of meloxicam, reviewed r/se/b. Declines PT at this time. HEP given.   3. Constipation, unspecified constipation type Not resolved with miralax. She does well with MOM, ok to use daily. Schedule GI appt.   Other orders - lisinopril (ZESTRIL) 20 MG tablet; Take 1 tablet (20 mg total) by mouth daily. - omeprazole (PRILOSEC) 20 MG capsule; Take 1 capsule  (20 mg total) by mouth 2 (two) times daily before a meal. - meloxicam (MOBIC) 15 MG tablet; Take 1 tablet (15 mg total) by mouth daily.  Return in about 6 months (around 09/03/2019).    Rutherford Guys, MD [facility] at [facility], [place] [address] Ph.  [phone] Fax [phone]

## 2019-05-02 ENCOUNTER — Encounter: Payer: Self-pay | Admitting: Family Medicine

## 2019-07-05 ENCOUNTER — Telehealth: Payer: Self-pay | Admitting: Family Medicine

## 2019-07-05 NOTE — Telephone Encounter (Signed)
Is is ok to refill these meds for patient. I do not se where we filled and inhaler and its been since 2019 the mometasone has been written

## 2019-07-05 NOTE — Telephone Encounter (Signed)
Pt would like a refill on her  Mometasone ointment 1% and albuterol (PROVENTIL HFA;VENTOLIN HFA) 108 (90 Base) [facility] [id number], [place] [address]  [place], [place] [address]  Phone:  [phone] Fax:  [phone]. Please advise at [phone].

## 2019-07-06 MED ORDER — MOMETASONE FUROATE 0.1 % EX CREA: 1.0000 "application " | TOPICAL_CREAM | Freq: Every day | CUTANEOUS | 2 refills | Status: DC

## 2019-07-06 MED ORDER — ALBUTEROL SULFATE HFA 108 (90 BASE) MCG/ACT IN AERS: 2.0000 | INHALATION_SPRAY | Freq: Four times a day (QID) | RESPIRATORY_TRACT | 2 refills | Status: DC | PRN

## 2019-09-01 ENCOUNTER — Ambulatory Visit: Payer: BC Managed Care – PPO | Admitting: Family Medicine

## 2019-09-26 ENCOUNTER — Other Ambulatory Visit: Payer: Self-pay | Admitting: Family Medicine

## 2019-09-26 NOTE — Telephone Encounter (Signed)
Requested Prescriptions  Pending Prescriptions Disp Refills  . VENTOLIN HFA 108 (90 Base) MCG/ACT inhaler [Pharmacy Med Name: VENTOLIN HFA INH W/DOS CTR 200PUFFS] 18 g 2    Sig: INHALE 2 PUFFS INTO THE LUNGS EVERY 6 HOURS AS NEEDED FOR WHEEZING OR SHORTNESS OF BREATH     Pulmonology:  Beta Agonists Failed - 09/26/2019  3:33 AM      Failed - One inhaler should last at least one month. If the patient is requesting refills earlier, contact the patient to check for uncontrolled symptoms.      Passed - Valid encounter within last 12 months    Recent Outpatient Visits          6 months ago Essential hypertension, benign   [facility] at [facility], Lilia Argue, MD   8 months ago Gastroesophageal reflux disease, esophagitis presence not specified   [facility], Lilia Argue, MD   1 year ago Essential hypertension, benign   [facility] at [facility], Lilia Argue, MD   1 year ago DUB (dysfunctional uterine bleeding)   [facility] at [facility], Lilia Argue, MD   1 year ago Essential hypertension, benign   [facility] at [facility], Lilia Argue, MD      Future Appointments            In 1 month Rutherford Guys, MD [facility] at [place], [facility]

## 2019-10-12 ENCOUNTER — Other Ambulatory Visit: Payer: Self-pay

## 2019-10-13 ENCOUNTER — Ambulatory Visit (INDEPENDENT_AMBULATORY_CARE_PROVIDER_SITE_OTHER): Payer: BC Managed Care – PPO | Admitting: Obstetrics & Gynecology

## 2019-10-13 ENCOUNTER — Encounter: Payer: Self-pay | Admitting: Obstetrics & Gynecology

## 2019-10-13 VITALS — BP 122/76 | Ht 62.0 in | Wt 159.0 lb

## 2019-10-13 DIAGNOSIS — Z01419 Encounter for gynecological examination (general) (routine) without abnormal findings: Secondary | ICD-10-CM

## 2019-10-13 DIAGNOSIS — E663 Overweight: Secondary | ICD-10-CM | POA: Diagnosis not present

## 2019-10-13 DIAGNOSIS — Z9851 Tubal ligation status: Secondary | ICD-10-CM | POA: Diagnosis not present

## 2019-10-13 DIAGNOSIS — N951 Menopausal and female climacteric states: Secondary | ICD-10-CM

## 2019-10-13 NOTE — Progress Notes (Signed)
[name] [birth year] [id number]   History:    49 y.o. [id number] Married.  S/P Tubal Ligation.  RP:  Established patient presenting for annual gyn exam   HPI: Symptomatic menopause with hot flushes for 6 months.  No PMB.  No pelvic pain.  Rarely sexually active, dry with IC.  Breasts normal.  Urine and bowel movements normal.  Body mass index 29.08.  Physically active at work.  Health labs with family physician.  Past medical history,surgical history, family history and social history were all reviewed and documented in the EPIC chart.  Gynecologic History Patient's last menstrual period was 04/10/2019.  Obstetric History OB History  Gravida Para Term Preterm AB Living  4 3 3  0 1 3  SAB TAB Ectopic Multiple Live Births  0 1 0 0      # Outcome Date GA Lbr Len/2nd Weight Sex Delivery Anes PTL Lv  4 TAB           3 Term           2 Term           1 Term              ROS: A ROS was performed and pertinent positives and negatives are included in the history.  GENERAL: No fevers or chills. HEENT: No change in vision, no earache, sore throat or sinus congestion. NECK: No pain or stiffness. CARDIOVASCULAR: No chest pain or pressure. No palpitations. PULMONARY: No shortness of breath, cough or wheeze. GASTROINTESTINAL: No abdominal pain, nausea, vomiting or diarrhea, melena or bright red blood per rectum. GENITOURINARY: No urinary frequency, urgency, hesitancy or dysuria. MUSCULOSKELETAL: No joint or muscle pain, no back pain, no recent trauma. DERMATOLOGIC: No rash, no itching, no lesions. ENDOCRINE: No polyuria, polydipsia, no heat or cold intolerance. No recent change in weight. HEMATOLOGICAL: No anemia or easy bruising or bleeding. NEUROLOGIC: No headache, seizures, numbness, tingling or weakness. PSYCHIATRIC: No depression, no loss of interest in normal activity or change in sleep pattern.     Exam:   BP 122/76   Ht 5\' 2"  (1.575 m)   Wt 159 lb (72.1 kg)   LMP  04/10/2019   BMI 29.08 kg/m   Body mass index is 29.08 kg/m.  General appearance : Well developed well nourished female. No acute distress HEENT: Eyes: no retinal hemorrhage or exudates,  Neck supple, trachea midline, no carotid bruits, no thyroidmegaly Lungs: Clear to auscultation, no rhonchi or wheezes, or rib retractions  Heart: Regular rate and rhythm, no murmurs or gallops Breast:Examined in sitting and supine position were symmetrical in appearance, no palpable masses or tenderness,  no skin retraction, no nipple inversion, no nipple discharge, no skin discoloration, no axillary or supraclavicular lymphadenopathy Abdomen: no palpable masses or tenderness, no rebound or guarding Extremities: no edema or skin discoloration or tenderness  Pelvic: Vulva: Normal             Vagina: No gross lesions or discharge  Cervix: No gross lesions or discharge.   Uterus  AV, normal size, shape and consistency, non-tender and mobile  Adnexa  Without masses or tenderness  Anus: Normal   Assessment/Plan:  49 y.o. female for annual exam   1. Well female exam with routine gynecological exam Normal gynecologic exam.  Pap test March 2020 was negative, no indication to repeat a Pap test this year.  Breast exam normal.  Schedule screening mammogram now.  Schedule colonoscopy.  Health labs with family  physician.  2. S/P tubal ligation  3. Perimenopause Hot flushes present with no menses for 6 months.  Probably entering menopause.  Counseling done, will try phyto-estrogens such as black cohosh at this time.  Vitamin D supplements, calcium intake of 1200 mg daily and regular weightbearing physical activity is recommended.  4. Overweight (BMI 25.0-29.9) Lower calorie/carb diet such as [employer] recommended.  Aerobic activities 5 times a week and light weightlifting every 2 days.  Princess Bruins MD, 11:41 AM 10/13/2019

## 2019-10-18 ENCOUNTER — Encounter: Payer: Self-pay | Admitting: Obstetrics & Gynecology

## 2019-10-18 NOTE — Patient Instructions (Signed)
1. Well female exam with routine gynecological exam Normal gynecologic exam.  Pap test March 2020 was negative, no indication to repeat a Pap test this year.  Breast exam normal.  Schedule screening mammogram now.  Schedule colonoscopy.  Health labs with family physician.  2. S/P tubal ligation  3. Perimenopause Hot flushes present with no menses for 6 months.  Probably entering menopause.  Counseling done, will try phyto-estrogens such as black cohosh at this time.  Vitamin D supplements, calcium intake of 1200 mg daily and regular weightbearing physical activity is recommended.  4. Overweight (BMI 25.0-29.9) Lower calorie/carb diet such as [employer] recommended.  Aerobic activities 5 times a week and light weightlifting every 2 days.  [name], it was a pleasure seeing you today!

## 2019-10-27 ENCOUNTER — Other Ambulatory Visit: Payer: Self-pay

## 2019-10-27 ENCOUNTER — Ambulatory Visit (INDEPENDENT_AMBULATORY_CARE_PROVIDER_SITE_OTHER): Payer: BC Managed Care – PPO | Admitting: Family Medicine

## 2019-10-27 ENCOUNTER — Encounter: Payer: Self-pay | Admitting: Gastroenterology

## 2019-10-27 ENCOUNTER — Encounter: Payer: Self-pay | Admitting: Family Medicine

## 2019-10-27 VITALS — BP 133/85 | HR 68 | Temp 98.4°F | Ht 62.0 in | Wt 160.0 lb

## 2019-10-27 DIAGNOSIS — J45909 Unspecified asthma, uncomplicated: Secondary | ICD-10-CM | POA: Diagnosis not present

## 2019-10-27 DIAGNOSIS — R7303 Prediabetes: Secondary | ICD-10-CM

## 2019-10-27 DIAGNOSIS — I10 Essential (primary) hypertension: Secondary | ICD-10-CM

## 2019-10-27 DIAGNOSIS — Z1211 Encounter for screening for malignant neoplasm of colon: Secondary | ICD-10-CM | POA: Diagnosis not present

## 2019-10-27 DIAGNOSIS — I83813 Varicose veins of bilateral lower extremities with pain: Secondary | ICD-10-CM

## 2019-10-27 DIAGNOSIS — E7800 Pure hypercholesterolemia, unspecified: Secondary | ICD-10-CM

## 2019-10-27 DIAGNOSIS — M2550 Pain in unspecified joint: Secondary | ICD-10-CM

## 2019-10-27 MED ORDER — ALBUTEROL SULFATE HFA 108 (90 BASE) MCG/ACT IN AERS: INHALATION_SPRAY | RESPIRATORY_TRACT | 2 refills | Status: DC

## 2019-10-27 MED ORDER — FEXOFENADINE HCL 180 MG PO TABS: 180.0000 mg | ORAL_TABLET | Freq: Every day | ORAL | 1 refills | Status: DC

## 2019-10-27 MED ORDER — MONTELUKAST SODIUM 10 MG PO TABS
10.0000 mg | ORAL_TABLET | Freq: Every day | ORAL | 11 refills | Status: DC
Start: 2019-10-27 — End: 2020-04-11

## 2019-10-27 NOTE — Progress Notes (Signed)
4/23/20219:47 AM  [name] March 11, 1971, 49 y.o., female [id number]  Chief Complaint  Patient presents with  . joint pain / back pain/ foot pain  . Pruritis    arms, legs, eyebrows - referral request to allergist  . seasonal allergies    nasal congestion, runny nose, itchy eyes    HPI:   Patient is a 49 y.o. female with past medical history significant for HTN, prediabetes, DUB, asthma, GERD, h pylori who presents today for routine followup  Last OV Aug 2020 Starting to have multiple joint pain, getting stiff when she stands, active at work, goes up and down stairs  Sometimes takes APAP or aleve Denies any redness, warmth or swelling of joints Has varicose veins - achy heavy legs at end of day, with occ swelling  Asthma mostly triggered by seasonal allergies Uses albuterol prn about 1-2 a month Uses zyrtec 35m daily as it makes her too sleepy Having very itchy skin, specially if exposed to sun   Lab Results  Component Value Date   HGBA1C 6.0 (H) 01/23/2019   HGBA1C 6.1 (H) 08/24/2018   HGBA1C 5.7 (H) 02/04/2018   Lab Results  Component Value Date   LDLCALC 141 (H) 08/24/2018   CREATININE 0.84 01/23/2019    Depression screen PHQ 2/9 10/27/2019 03/06/2019 01/23/2019  Decreased Interest 0 0 0  Down, Depressed, Hopeless 0 0 0  PHQ - 2 Score 0 0 0    Fall Risk  10/27/2019 03/06/2019 01/23/2019 08/24/2018 05/24/2018  Falls in the past year? 0 0 0 0 0  Number falls in past yr: 0 0 0 0 -  Injury with Fall? 0 0 0 0 -  Follow up Falls evaluation completed - - - -     Allergies  Allergen Reactions  . Fish Allergy Nausea And Vomiting    Prior to Admission medications   Medication Sig Start Date End Date Taking? Authorizing Provider  cetirizine (ZYRTEC ALLERGY) 10 MG tablet Take 10 mg by mouth daily as needed for allergies.    Yes [provider]  lisinopril (ZESTRIL) 20 MG tablet Take 1 tablet (20 mg total) by mouth daily. 03/06/19  Yes SRutherford Guys MD  mometasone (ELOCON) 0.1 % cream Apply 1 application topically daily. 07/06/19  Yes SRutherford Guys MD  omeprazole (PRILOSEC) 20 MG capsule Take 1 capsule (20 mg total) by mouth 2 (two) times daily before a meal. 03/06/19  Yes SRutherford Guys MD  VENTOLIN HFA 108 (90 Base) MCG/ACT inhaler INHALE 2 PUFFS INTO THE LUNGS EVERY 6 HOURS AS NEEDED FOR WHEEZING OR SHORTNESS OF BREATH 09/26/19  Yes SRutherford Guys MD  meloxicam (MOBIC) 15 MG tablet Take 1 tablet (15 mg total) by mouth daily. Patient not taking: Reported on 10/13/2019 03/06/19   SRutherford Guys MD    Past Medical History:  Diagnosis Date  . Adnexal mass    Left  . Allergy    pollen allergy  . Asthma    only in Spring- allergic to pollens  . Complication of anesthesia    had [place] lot [place] bleeding and blood [place] from [place] had to be read[place] to hospital, no blood transfusion was necessary  . GERD (gastroesophageal reflux disease)   . Headache    occ  . Hypertension    Not taking any medication at this time, Was taking Lisinopril 10 mg but has not had any for 1 month  ran out of prescription, didnt have a PCP until 12/2017  . Poison ivy 12/2017  . SUI (stress urinary incontinence, female)   . Vitamin D deficiency     Past Surgical History:  Procedure Laterality Date  . CESAREAN SECTION    . LAPAROSCOPIC TUBAL LIGATION Bilateral 08/22/2012   Procedure: LAPAROSCOPIC TUBAL LIGATION;  Surgeon: Woodroe Mode, MD;  Location: [facility];  Service: Gynecology;  Laterality: Bilateral;  IUD removal  . LIPOSUCTION TRUNK  2010  . OTHER SURGICAL HISTORY     Had a surgery in [place]. [place] unknown name,  . REDUCTION MAMMAPLASTY  2010  . ROBOTIC ASSISTED LAPAROSCOPIC LYSIS OF ADHESION  01/03/2018   Procedure: XI ROBOTIC ASSISTED LAPAROSCOPIC LYSIS OF ADHESIONS BETWEEN OMENTUM AND ABDOMINAL WALL;  Surgeon: Princess Bruins, MD;  Location: [facility];  Service: Gynecology;;    Social  History   Tobacco Use  . Smoking status: Never Smoker  . Smokeless tobacco: Never Used  Substance Use Topics  . Alcohol use: No    Family History  Problem Relation Age of Onset  . Heart disease Father   . Diabetes Sister   . Hypertension Sister   . Hypertension Brother   . Liver disease Mother   . Breast cancer Maternal Aunt   . Colon cancer Neg Hx   . Esophageal cancer Neg Hx     Review of Systems  Constitutional: Negative for chills and fever.  Respiratory: Negative for cough and shortness of breath.   Cardiovascular: Negative for chest pain, palpitations and leg swelling.  Gastrointestinal: Negative for abdominal pain, nausea and vomiting.     OBJECTIVE:  Today's Vitals   10/27/19 0936  BP: 133/85  Pulse: 68  Temp: 98.4 F (36.9 C)  SpO2: 99%  Weight: 160 lb (72.6 kg)  Height: 5' 2"  (1.575 m)   Body mass index is 29.26 kg/m.  Wt Readings from Last 3 Encounters:  10/27/19 160 lb (72.6 kg)  10/13/19 159 lb (72.1 kg)  03/06/19 163 lb 12.8 oz (74.3 kg)    Physical Exam Vitals and nursing note reviewed.  Constitutional:      Appearance: She is well-developed.  HENT:     Head: Normocephalic and atraumatic.     Mouth/Throat:     Pharynx: No oropharyngeal exudate.  Eyes:     General: No scleral icterus.    Conjunctiva/sclera: Conjunctivae normal.     Pupils: Pupils are equal, round, and reactive to light.  Cardiovascular:     Rate and Rhythm: Normal rate and regular rhythm.     Heart sounds: Normal heart sounds. No murmur. No friction rub. No gallop.   Pulmonary:     Effort: Pulmonary effort is normal.     Breath sounds: Normal breath sounds. No wheezing or rales.  Musculoskeletal:     Cervical back: Neck supple.  Skin:    General: Skin is warm and dry.  Neurological:     Mental Status: She is alert and oriented to person, place, and time.     No results found for this or any previous visit (from the past 24 hour(s)).  No results  found.   ASSESSMENT and PLAN  1. Screen for colon cancer - Ambulatory referral to Gastroenterology  2. Essential hypertension, benign Controlled. Continue current regime.  - CMP14+EGFR  3. Pre-diabetes Checking labs today, medications will be started as needed.  - Hemoglobin A1c  4. Asthma due to seasonal allergies Asthma stable. Allergies not well controlled. Discussed changing to allegra  as less somnolence experienced, adding singulair, reviewed r/se/b  5. Arthralgia, unspecified joint Discussed supportive measures. Strengthening/stretching. APAP/NSAIDS prn.  6. Varicose veins of both lower extremities with pain Discussed compression stocking, use of horse chestnut supplement  7. Pure hypercholesterolemia Checking labs today, medications will be started as needed.  - Lipid panel  Other orders - albuterol (VENTOLIN HFA) 108 (90 Base) MCG/ACT inhaler; INHALE 2 PUFFS INTO THE LUNGS EVERY 6 HOURS AS NEEDED FOR WHEEZING OR SHORTNESS OF BREATH - montelukast (SINGULAIR) 10 MG tablet; Take 1 tablet (10 mg total) by mouth at bedtime. - fexofenadine (ALLEGRA) 180 MG tablet; Take 1 tablet (180 mg total) by mouth daily.  Return in about 6 months (around 04/27/2020).    Rutherford Guys, MD [facility] [place], [place] [address] Ph.  [phone] Fax [phone]

## 2019-10-27 NOTE — Patient Instructions (Addendum)
pruebe suplemento de horse chestnut para la venas varicosas    If you have lab work done today you will be contacted with your lab results within the next 2 weeks.  If you have not heard from [place] then please contact us. The fastest way to get your results is to register for My Chart.   IF you received an x-ray today, you will receive an invoice from [facility]. Please contact [facility] at [phone] with questions or concerns regarding your invoice.   IF you received labwork today, you will receive an invoice from [facility]. Please contact [facility] at [phone] with questions or concerns regarding your invoice.   Our billing staff will not be able to assist you with questions regarding bills from these companies.  You will be contacted with the lab results as soon as they are available. The fastest way to get your results is to activate your My Chart account. Instructions are located on the last page of this paperwork. If you have not heard from [place] regarding the results in 2 weeks, please contact this office.

## 2019-10-28 LAB — HEMOGLOBIN A1C
Est. average glucose Bld gHb Est-mCnc: 131 mg/dL
Hgb A1c MFr Bld: 6.2 % — ABNORMAL HIGH (ref 4.8–5.6)

## 2019-10-28 LAB — LIPID PANEL
Chol/HDL Ratio: 5.4 ratio — ABNORMAL HIGH (ref 0.0–4.4)
Cholesterol, Total: 222 mg/dL — ABNORMAL HIGH (ref 100–199)
HDL: 41 mg/dL (ref >39)
LDL Chol Calc (NIH): 153 mg/dL — ABNORMAL HIGH (ref 0–99)
Triglycerides: 153 mg/dL — ABNORMAL HIGH (ref 0–149)
VLDL Cholesterol Cal: 28 mg/dL (ref 5–40)

## 2019-10-28 LAB — CMP14+EGFR
ALT: 28 IU/L (ref 0–32)
AST: 27 IU/L (ref 0–40)
Albumin/Globulin Ratio: 1.3 (ref 1.2–2.2)
Albumin: 4.6 g/dL (ref 3.8–4.8)
Alkaline Phosphatase: 101 IU/L (ref 39–117)
BUN/Creatinine Ratio: 19 (ref 9–23)
BUN: 16 mg/dL (ref 6–24)
Bilirubin Total: 0.3 mg/dL (ref 0.0–1.2)
CO2: 25 mmol/L (ref 20–29)
Calcium: 10.2 mg/dL (ref 8.7–10.2)
Chloride: 99 mmol/L (ref 96–106)
Creatinine, Ser: 0.83 mg/dL (ref 0.57–1.00)
GFR calc Af Amer: 95 mL/min/{1.73_m2} (ref >59)
GFR calc non Af Amer: 82 mL/min/{1.73_m2} (ref >59)
Globulin, Total: 3.5 g/dL (ref 1.5–4.5)
Glucose: 91 mg/dL (ref 65–99)
Potassium: 4.8 mmol/L (ref 3.5–5.2)
Sodium: 139 mmol/L (ref 134–144)
Total Protein: 8.1 g/dL (ref 6.0–8.5)

## 2019-11-20 NOTE — Progress Notes (Signed)
Letter mailed

## 2019-11-24 ENCOUNTER — Other Ambulatory Visit: Payer: Self-pay

## 2019-11-24 ENCOUNTER — Ambulatory Visit (AMBULATORY_SURGERY_CENTER): Payer: Self-pay

## 2019-11-24 VITALS — Ht 62.0 in | Wt 159.2 lb

## 2019-11-24 DIAGNOSIS — Z1211 Encounter for screening for malignant neoplasm of colon: Secondary | ICD-10-CM

## 2019-11-24 DIAGNOSIS — Z01818 Encounter for other preprocedural examination: Secondary | ICD-10-CM

## 2019-11-24 NOTE — Progress Notes (Signed)
No allergies to soy or egg Pt is not on blood thinners or diet pills Denies issues with sedation/intubation Denies atrial flutter/fib Denies constipation   Emmi instructions given to pt  Pt is aware of Covid safety and care partner requirements.  

## 2019-11-27 ENCOUNTER — Encounter: Payer: Self-pay | Admitting: Gastroenterology

## 2019-11-30 ENCOUNTER — Other Ambulatory Visit: Payer: Self-pay | Admitting: Family Medicine

## 2019-12-06 ENCOUNTER — Other Ambulatory Visit: Payer: Self-pay | Admitting: Gastroenterology

## 2019-12-06 ENCOUNTER — Ambulatory Visit (INDEPENDENT_AMBULATORY_CARE_PROVIDER_SITE_OTHER): Payer: BC Managed Care – PPO

## 2019-12-06 DIAGNOSIS — Z1159 Encounter for screening for other viral diseases: Secondary | ICD-10-CM

## 2019-12-07 LAB — SARS CORONAVIRUS 2 (TAT 6-24 HRS): SARS Coronavirus 2: NEGATIVE

## 2019-12-08 ENCOUNTER — Encounter: Payer: Self-pay | Admitting: Gastroenterology

## 2019-12-08 ENCOUNTER — Other Ambulatory Visit: Payer: Self-pay

## 2019-12-08 ENCOUNTER — Ambulatory Visit (AMBULATORY_SURGERY_CENTER): Payer: BC Managed Care – PPO | Admitting: Gastroenterology

## 2019-12-08 VITALS — BP 137/75 | HR 50 | Temp 97.3°F | Resp 11 | Ht 62.0 in | Wt 159.0 lb

## 2019-12-08 DIAGNOSIS — D124 Benign neoplasm of descending colon: Secondary | ICD-10-CM | POA: Diagnosis not present

## 2019-12-08 DIAGNOSIS — Z1211 Encounter for screening for malignant neoplasm of colon: Secondary | ICD-10-CM

## 2019-12-08 MED ORDER — SODIUM CHLORIDE 0.9 % IV SOLN: 500.0000 mL | Freq: Once | INTRAVENOUS | Status: DC

## 2019-12-08 NOTE — Op Note (Signed)
[facility] Patient Name: [name] Procedure Date: 12/08/2019 11:26 AM MRN: [id number] Endoscopist: Gerrit Heck , MD Age: 49 Referring MD:  Date of Birth: 19-May-1971 Gender: Female Account #: [id number] Procedure:                Colonoscopy Indications:              Screening for colorectal malignant neoplasm, This                            is the patient's first colonoscopy Medicines:                Monitored Anesthesia Care Procedure:                Pre-Anesthesia Assessment:                           - Prior to the procedure, a History and Physical                            was performed, and patient medications and                            allergies were reviewed. The patient's tolerance of                            previous anesthesia was also reviewed. The risks                            and benefits of the procedure and the sedation                            options and risks were discussed with the patient.                            All questions were answered, and informed consent                            was obtained. Prior Anticoagulants: The patient has                            taken no previous anticoagulant or antiplatelet                            agents. ASA Grade Assessment: II - A patient with                            mild systemic disease. After reviewing the risks                            and benefits, the patient was deemed in                            satisfactory condition to undergo the procedure.  After obtaining informed consent, the colonoscope                            was passed under direct vision. Throughout the                            procedure, the patient's blood pressure, pulse, and                            oxygen saturations were monitored continuously. The                            Colonoscope was introduced through the anus and                            advanced to the the  terminal ileum. The colonoscopy                            was performed without difficulty. The patient                            tolerated the procedure well. The quality of the                            bowel preparation was adequate. The terminal ileum,                            ileocecal valve, appendiceal orifice, and rectum                            were photographed. Scope In: 11:33:23 AM Scope Out: 11:52:09 AM Scope Withdrawal Time: 0 hours 15 minutes 54 seconds  Total Procedure Duration: 0 hours 18 minutes 46 seconds  Findings:                 The perianal and digital rectal examinations were                            normal.                           Two sessile polyps were found in the descending                            colon. The polyps were 3 to 4 mm in size. These                            polyps were removed with a cold snare. Resection                            and retrieval were complete. Estimated blood loss                            was minimal.  The exam was otherwise normal throughout the                            remainder of the colon.                           The retroflexed view of the distal rectum and anal                            verge was normal and showed no anal or rectal                            abnormalities.                           The terminal ileum appeared normal. Complications:            No immediate complications. Estimated Blood Loss:     Estimated blood loss was minimal. Impression:               - Two 3 to 4 mm polyps in the descending colon,                            removed with a cold snare. Resected and retrieved.                           - The distal rectum and anal verge are normal on                            retroflexion view.                           - The examined portion of the ileum was normal. Recommendation:           - Patient has a contact number available for                             emergencies. The signs and symptoms of potential                            delayed complications were discussed with the                            patient. Return to normal activities tomorrow.                            Written discharge instructions were provided to the                            patient.                           - Resume previous diet.                           - Continue present medications.                           -  Await pathology results.                           - Repeat colonoscopy in 5-10 years for surveillance                            based on pathology results.                           - Return to GI office PRN. Gerrit Heck, MD 12/08/2019 11:55:51 AM

## 2019-12-08 NOTE — Progress Notes (Signed)
Pt's states no medical or surgical changes since previsit or office visit.   CW vitals and PH Iv.

## 2019-12-08 NOTE — Patient Instructions (Signed)
HANDOUTS PROVIDED ON: POLYPS  The polyps removed today have been sent for pathology.  The results can take 1-3 weeks to receive.  When your next colonoscopy should occur will be based on the pathology results.    You may resume your previous diet and medication schedule.  Thank you for allowing us to care for you today!!!   YOU HAD AN ENDOSCOPIC PROCEDURE TODAY AT THE [facility]:   Refer to the procedure report that was given to you for any specific questions about what was found during the examination.  If the procedure report does not answer your questions, please call your gastroenterologist to clarify.  If you requested that your care partner not be given the details of your procedure findings, then the procedure report has been included in a sealed envelope for you to review at your convenience later.  YOU SHOULD EXPECT: Some feelings of bloating in the abdomen. Passage of more gas than usual.  Walking can help get rid of the air that was put into your GI tract during the procedure and reduce the bloating. If you had a lower endoscopy (such as a colonoscopy or flexible sigmoidoscopy) you may notice spotting of blood in your stool or on the toilet paper. If you underwent a bowel prep for your procedure, you may not have a normal bowel movement for a few days.  Please Note:  You might notice some irritation and congestion in your nose or some drainage.  This is from the oxygen used during your procedure.  There is no need for concern and it should clear up in a day or so.  SYMPTOMS TO REPORT IMMEDIATELY:   Following lower endoscopy (colonoscopy or flexible sigmoidoscopy):  Excessive amounts of blood in the stool  Significant tenderness or worsening of abdominal pains  Swelling of the abdomen that is new, acute  Fever of 100F or higher  For urgent or emergent issues, a gastroenterologist can be reached at any hour by calling [phone]. Do not use MyChart messaging for  urgent concerns.    DIET:  We do recommend a small meal at first, but then you may proceed to your regular diet.  Drink plenty of fluids but you should avoid alcoholic beverages for 24 hours.  ACTIVITY:  You should plan to take it easy for the rest of today and you should NOT DRIVE or use heavy machinery until tomorrow (because of the sedation medicines used during the test).    FOLLOW UP: Our staff will call the number listed on your records 48-72 hours following your procedure to check on you and address any questions or concerns that you may have regarding the information given to you following your procedure. If we do not reach you, we will leave a message.  We will attempt to reach you two times.  During this call, we will ask if you have developed any symptoms of COVID 19. If you develop any symptoms (ie: fever, flu-like symptoms, shortness of breath, cough etc.) before then, please call [phone].  If you test positive for Covid 19 in the 2 weeks post procedure, please call and report this information to us.    If any biopsies were taken you will be contacted by phone or by letter within the next 1-3 weeks.  Please call us at [phone] if you have not heard about the biopsies in 3 weeks.    SIGNATURES/CONFIDENTIALITY: You and/or your care partner have signed paperwork which will be entered into   your electronic medical record.  These signatures attest to the fact that that the information above on your After Visit Summary has been reviewed and is understood.  Full responsibility of the confidentiality of this discharge information lies with you and/or your care-partner. 

## 2019-12-08 NOTE — Progress Notes (Signed)
Called to room to assist during endoscopic procedure.  Patient ID and intended procedure confirmed with present staff. Received instructions for my participation in the procedure from the performing physician.  

## 2019-12-08 NOTE — Progress Notes (Signed)
A and O x3. Report to RN. Tolerated MAC anesthesia well.

## 2019-12-12 ENCOUNTER — Telehealth: Payer: Self-pay

## 2019-12-12 ENCOUNTER — Encounter: Payer: Self-pay | Admitting: Gastroenterology

## 2019-12-12 NOTE — Telephone Encounter (Signed)
1st follow up call made.  NALM 

## 2019-12-12 NOTE — Telephone Encounter (Signed)
  Follow up Call-  Call back number 12/08/2019 03/11/2018  Post procedure Call Back phone  # [phone]  Permission to leave phone message Yes Yes  Some recent data might be hidden     Patient questions:  Do you have a fever, pain , or abdominal swelling? No. Pain Score  0 *  Have you tolerated food without any problems? Yes.    Have you been able to return to your normal activities? Yes.    Do you have any questions about your discharge instructions: Diet   No. Medications  No. Follow up visit  No.  Do you have questions or concerns about your Care? No.  Actions: * If pain score is 4 or above: No action needed, pain <4.  1. Have you developed a fever since your procedure? no  2.   Have you had an respiratory symptoms (SOB or cough) since your procedure? no  3.   Have you tested positive for COVID 19 since your procedure no  4.   Have you had any family members/close contacts diagnosed with the COVID 19 since your procedure?  no   If yes to any of these questions please route to Joylene John, RN and Erenest Rasher, RN

## 2019-12-28 ENCOUNTER — Other Ambulatory Visit: Payer: Self-pay | Admitting: Family Medicine

## 2020-01-05 ENCOUNTER — Telehealth: Payer: Self-pay | Admitting: Family Medicine

## 2020-01-05 ENCOUNTER — Other Ambulatory Visit: Payer: Self-pay

## 2020-01-05 MED ORDER — MOMETASONE FUROATE 0.1 % EX CREA: 1.0000 "application " | TOPICAL_CREAM | Freq: Every day | CUTANEOUS | 2 refills | Status: DC

## 2020-01-05 NOTE — Telephone Encounter (Signed)
done

## 2020-01-05 NOTE — Telephone Encounter (Signed)
Pt has an appt coming up in October asking for a refill on What is the name of the medication? mometasone (ELOCON) 0.1 % cream [[id number]   Have you contacted your pharmacy to request a refill? n  Which pharmacy would you like this sent to? [facility] [place], [address]  [place], [place] [address]  Phone:  [phone] Fax:  [phone]  DEA #:  [id number]   Patient notified that their request is being sent to the clinical staff for review and that they should receive a call once it is complete. If they do not receive a call within 72 hours they can check with their pharmacy or our office.

## 2020-03-06 ENCOUNTER — Other Ambulatory Visit: Payer: Self-pay

## 2020-03-06 ENCOUNTER — Other Ambulatory Visit: Payer: BC Managed Care – PPO

## 2020-03-06 DIAGNOSIS — Z20822 Contact with and (suspected) exposure to covid-19: Secondary | ICD-10-CM

## 2020-03-07 LAB — NOVEL CORONAVIRUS, NAA: SARS-CoV-2, NAA: DETECTED — AB

## 2020-03-08 ENCOUNTER — Telehealth: Payer: Self-pay | Admitting: Infectious Diseases

## 2020-03-08 ENCOUNTER — Other Ambulatory Visit: Payer: Self-pay | Admitting: Infectious Diseases

## 2020-03-08 ENCOUNTER — Telehealth: Payer: Self-pay

## 2020-03-08 DIAGNOSIS — U071 COVID-19: Secondary | ICD-10-CM

## 2020-03-08 DIAGNOSIS — Z609 Problem related to social environment, unspecified: Secondary | ICD-10-CM

## 2020-03-08 NOTE — Telephone Encounter (Signed)
Called to Discuss with patient about Covid symptoms and the use of the monoclonal antibody infusion for those with mild to moderate Covid symptoms and at a high risk of hospitalization.     Pt appears to qualify for this infusion due to co-morbid conditions and/or a member of an at-risk group in accordance with the FDA Emergency Use Authorization.   Criteria met for treatment include hypertension   Unable to reach pt  LVM to call back   Will route to PCP as it looks like she has requested a virtual visit today to discuss management for COVID infection.

## 2020-03-08 NOTE — Telephone Encounter (Signed)
Called pt and sch appt for 03/12/20 as virtual.

## 2020-03-08 NOTE — Telephone Encounter (Signed)
The following was discussed with the patient:   You have been scheduled to receive Regeneron (the monoclonal antibody we discussed) on : Saturday 9/04 @ 4 PM   If you have been tested outside of a [facility] - you MUST bring a copy of your positive test with you the morning of your appointment. You may take a photo of this and upload to your MyChart portal or have the testing facility fax the result to [phone]    The address for the infusion clinic site is:  --GPS address is [place] - the parking is located near [employer] building where you will see  COVID19 Infusion feather banner marking the entrance to parking.   (see photos below)            --Enter into the 2nd entrance where the "wave, flag banner" is at the road. Turn into this 2nd entrance and immediately turn left to park in 1 of the 5 parking spots.   --Please stay in your car and call the desk for assistance inside [phone].   --Average time in department is roughly 2 hours for Regeneron treatment - this includes preparation of the medication, IV start and the required 1 hour monitoring after the infusion.    Should you develop worsening shortness of breath, chest pain or severe breathing problems please do not wait for this appointment and go to the Emergency room for evaluation and treatment. You will undergo another oxygen screen before your infusion to ensure this is the best treatment option for you. There is a chance that the best decision may be to send you to the Emergency Room for evaluation at the time of your appointment.   The day of your visit you should: Marland Kitchen Get plenty of rest the night before and drink plenty of water . Eat a light meal/snack before coming and take your medications as prescribed  . Wear warm, comfortable clothes with a shirt that can roll-up over the elbow (will need IV start).  . Wear a mask  . Consider bringing some activity to help pass the time   Many  commercial insurers are waiving bills related to [facility] treatment however some have ranged from $300-640. We are starting to see some insurers send bills to patients later for the administration of the medication - we are learning more information but you may receive a bill after your appointment.  Please contact your insurance agent to discuss prior to your appointment if you would like further details about billing specific to your policy.    I hope this helps find you feeling better,  Janene Madeira

## 2020-03-08 NOTE — Telephone Encounter (Signed)
I have called the pt and informed her of the message by both providers. I have given the number to the patient and she will call to get the infusion scheduled.

## 2020-03-08 NOTE — Telephone Encounter (Signed)
Please call patient and relay above message. I agree with monoclonal ab. Her appt with me is on the 7th and initiating treatment is time sensitive so I would not want to wait until then to relay this message. She can call [phone] to schedule her infusion appt. thanks

## 2020-03-08 NOTE — Progress Notes (Signed)
I connected by phone with [name] on 03/08/2020 at 7:40 PM to discuss the potential use of a new treatment for mild to moderate COVID-19 viral infection in non-hospitalized patients.  This patient is a 49 y.o. female that meets the FDA criteria for Emergency Use Authorization of COVID monoclonal antibody casirivimab/imdevimab.  Has a (+) direct SARS-CoV-2 viral test result  Has mild or moderate COVID-19   Is NOT hospitalized due to COVID-19  Is within 10 days of symptom onset  Has at least one of the high risk factor(s) for progression to severe COVID-19 and/or hospitalization as defined in EUA.  Specific high risk criteria : Cardiovascular disease or hypertension and Other high risk medical condition per CDC:  high SVI    I have spoken and communicated the following to the patient or parent/caregiver regarding COVID monoclonal antibody treatment:  1. FDA has authorized the emergency use for the treatment of mild to moderate COVID-19 in adults and pediatric patients with positive results of direct SARS-CoV-2 viral testing who are 48 years of age and older weighing at least 40 kg, and who are at high risk for progressing to severe COVID-19 and/or hospitalization.  2. The significant known and potential risks and benefits of COVID monoclonal antibody, and the extent to which such potential risks and benefits are unknown.  3. Information on available alternative treatments and the risks and benefits of those alternatives, including clinical trials.  4. Patients treated with COVID monoclonal antibody should continue to self-isolate and use infection control measures (e.g., wear mask, isolate, social distance, avoid sharing personal items, clean and disinfect "high touch" surfaces, and frequent handwashing) according to CDC guidelines.   5. The patient or parent/caregiver has the option to accept or refuse COVID monoclonal antibody treatment.  After reviewing this information with  the patient, The patient agreed to proceed with receiving casirivimab\imdevimab infusion and will be provided a copy of the Fact sheet prior to receiving the infusion. Janene Madeira 03/08/2020 7:40 PM

## 2020-03-08 NOTE — Telephone Encounter (Signed)
Please schedule virtual appt for pt to assist with getting guidance after becoming COVID positive.

## 2020-03-08 NOTE — Telephone Encounter (Signed)
Pt. Is positive for COVID 19. Reports she has cough, fever, fatigue, body aches. Taking Dayquil and Nyquil, not helping. Fever has been 102, comes down with medication, but goes back up in 4 hours. Practice closed for lunch. Pt. Would like medication or virtual visit.Practice is closed for lunch.

## 2020-03-09 ENCOUNTER — Ambulatory Visit (HOSPITAL_COMMUNITY)
Admission: RE | Admit: 2020-03-09 | Discharge: 2020-03-09 | Disposition: A | Discharge status: Home / Self Care | Payer: BC Managed Care – PPO | Source: Ambulatory Visit | Attending: Pulmonary Disease | Admitting: Pulmonary Disease

## 2020-03-09 DIAGNOSIS — Z609 Problem related to social environment, unspecified: Secondary | ICD-10-CM | POA: Diagnosis present

## 2020-03-09 DIAGNOSIS — U071 COVID-19: Secondary | ICD-10-CM

## 2020-03-09 MED ORDER — SODIUM CHLORIDE 0.9 % IV SOLN
1200.0000 mg | Freq: Once | INTRAVENOUS | Status: AC
  Administered 2020-03-09: 1200 mg via INTRAVENOUS
  Filled 2020-03-09: qty 10

## 2020-03-09 MED ORDER — ALBUTEROL SULFATE HFA 108 (90 BASE) MCG/ACT IN AERS: 2.0000 | INHALATION_SPRAY | Freq: Once | RESPIRATORY_TRACT | Status: DC | PRN

## 2020-03-09 MED ORDER — DIPHENHYDRAMINE HCL 50 MG/ML IJ SOLN: 50.0000 mg | Freq: Once | INTRAMUSCULAR | Status: DC | PRN

## 2020-03-09 MED ORDER — METHYLPREDNISOLONE SODIUM SUCC 125 MG IJ SOLR: 125.0000 mg | Freq: Once | INTRAMUSCULAR | Status: DC | PRN

## 2020-03-09 MED ORDER — FAMOTIDINE IN NACL 20-0.9 MG/50ML-% IV SOLN: 20.0000 mg | Freq: Once | INTRAVENOUS | Status: DC | PRN

## 2020-03-09 MED ORDER — SODIUM CHLORIDE 0.9 % IV SOLN: INTRAVENOUS | Status: DC | PRN

## 2020-03-09 MED ORDER — EPINEPHRINE 0.3 MG/0.3ML IJ SOAJ: 0.3000 mg | Freq: Once | INTRAMUSCULAR | Status: DC | PRN

## 2020-03-09 NOTE — Discharge Instructions (Signed)

## 2020-03-09 NOTE — Progress Notes (Signed)
  Diagnosis: COVID-19  Physician: Asencion Noble, MD  Procedure: [facility] Infusion Clinic Med: casirivimab\imdevimab infusion - Provided patient with casirivimab\imdevimab fact sheet for patients, parents and caregivers prior to infusion.  Complications: No immediate complications noted.  Discharge: Discharged home   [name] 03/09/2020

## 2020-03-12 ENCOUNTER — Other Ambulatory Visit: Payer: Self-pay

## 2020-03-12 ENCOUNTER — Telehealth (INDEPENDENT_AMBULATORY_CARE_PROVIDER_SITE_OTHER): Payer: BC Managed Care – PPO | Admitting: Family Medicine

## 2020-03-12 DIAGNOSIS — U071 COVID-19: Secondary | ICD-10-CM

## 2020-03-12 MED ORDER — BENZONATATE 100 MG PO CAPS: 100.0000 mg | ORAL_CAPSULE | Freq: Three times a day (TID) | ORAL | 1 refills | Status: DC | PRN

## 2020-03-12 NOTE — Progress Notes (Signed)
Virtual Visit Note  I connected with patient on 03/12/20 at 558 pm by phone  and verified that I am speaking with the correct person using two identifiers. [name] is currently located at home and patient is currently with them during visit. The provider, Rutherford Guys, MD is located in their office at time of visit.  I discussed the limitations, risks, security and privacy concerns of performing an evaluation and management service by telephone and the availability of in person appointments. I also discussed with the patient that there may be a patient responsible charge related to this service. The patient expressed understanding and agreed to proceed.   I provided 12 minutes of non-face-to-face time during this encounter.  Chief Complaint  Patient presents with  . Follow-up    pt says she is still feeling really sick, went to have infusion done on 9/4. She is taking tylenol for her sx. Yesterday fever was 102. She is needing letter for her job. Copy of letter for pt daughter to pick up  stating she is pos for covid    HPI ? Tested positive for covid 19 on sept 1st Had monoclonal ab infusion sept 4th Today she started feeling a bit better She started feeling sick a 9 days ago Last fever was yesterday Body aches and headaches improved No appetite, dry cracked lips fatigued Chest congestion with cough No SOB or wheezing but unable to take deep breath as it causes cough having mild anxiety She has been taking APAP, dayquil and nyquil Tried albuterol but unable to use effectively She has not been vaccinated She remains in quarantine   Allergies  Allergen Reactions  . Fish Allergy Nausea And Vomiting    Prior to Admission medications   Medication Sig Start Date End Date Taking? Authorizing Provider  Acetaminophen (TYLENOL PO) Take by mouth.   Yes [provider]  albuterol (VENTOLIN HFA) 108 (90 Base) MCG/ACT inhaler INHALE 2 PUFFS INTO THE LUNGS  EVERY 6 HOURS AS NEEDED FOR WHEEZING OR SHORTNESS OF BREATH 12/28/19  Yes Rutherford Guys, MD  fexofenadine (ALLEGRA) 180 MG tablet Take 1 tablet (180 mg total) by mouth daily. 10/27/19  Yes Rutherford Guys, MD  lisinopril (ZESTRIL) 20 MG tablet TAKE 1 TABLET(20 MG) BY MOUTH DAILY 11/30/19  Yes Rutherford Guys, MD  mometasone (ELOCON) 0.1 % cream Apply 1 application topically daily. 01/05/20  Yes Rutherford Guys, MD  montelukast (SINGULAIR) 10 MG tablet Take 1 tablet (10 mg total) by mouth at bedtime. 10/27/19  Yes Rutherford Guys, MD  omeprazole (PRILOSEC) 20 MG capsule Take 1 capsule (20 mg total) by mouth 2 (two) times daily before a meal. 03/06/19  Yes Rutherford Guys, MD    Past Medical History:  Diagnosis Date  . Adnexal mass    Left  . Allergy    pollen allergy  . Asthma    only in Spring- allergic to pollens  . Complication of anesthesia    had a lot of bleeding and blood clots from vagina after surgery in [place]. [place] over 15 years ago, had to be readmitted to hospital, no blood transfusion was necessary  . GERD (gastroesophageal reflux disease)   . Headache    occ  . Hypertension    Not taking any medication at this time, Was taking Lisinopril 10 mg but has not had any for 1 month ran out of prescription, didnt have a PCP until 12/2017  . Poison ivy 12/2017  .  SUI (stress urinary incontinence, female)   . Vitamin D deficiency     Past Surgical History:  Procedure Laterality Date  . CESAREAN SECTION    . LAPAROSCOPIC TUBAL LIGATION Bilateral 08/22/2012   Procedure: LAPAROSCOPIC TUBAL LIGATION;  Surgeon: Woodroe Mode, MD;  Location: [facility];  Service: Gynecology;  Laterality: Bilateral;  IUD removal  . LIPOSUCTION TRUNK  2010  . OTHER SURGICAL HISTORY     Had a surgery in [place]. [place] unknown name,  . REDUCTION MAMMAPLASTY  2010  . ROBOTIC ASSISTED LAPAROSCOPIC LYSIS OF ADHESION  01/03/2018   Procedure: XI ROBOTIC ASSISTED LAPAROSCOPIC LYSIS OF ADHESIONS BETWEEN OMENTUM  AND ABDOMINAL WALL;  Surgeon: Princess Bruins, MD;  Location: [facility];  Service: Gynecology;;  . UPPER GASTROINTESTINAL ENDOSCOPY  2019    Social History   Tobacco Use  . Smoking status: Never Smoker  . Smokeless tobacco: Never Used  Substance Use Topics  . Alcohol use: No    Family History  Problem Relation Age of Onset  . Heart disease Father   . Diabetes Sister   . Hypertension Sister   . Hypertension Brother   . Liver disease Mother   . Breast cancer Maternal Aunt   . Colon cancer Neg Hx   . Esophageal cancer Neg Hx   . Colon polyps Neg Hx   . Rectal cancer Neg Hx   . Stomach cancer Neg Hx     ROS Per hpi  Objective  Vitals as reported by the patient: none  Gen: AAOx3, NAD Speaking comfortably in full sentences  occ dry cough heard  ASSESSMENT and PLAN  1. COVID-19 virus infection Clinically stable. S/p tx with monoclonal ab. Discussed quarantine guidelines. Reviewed RTC precautions  Other orders - Acetaminophen (TYLENOL PO); Take by mouth. - benzonatate (TESSALON) 100 MG capsule; Take 1-2 capsules (100-200 mg total) by mouth 3 (three) times daily as needed for cough.  FOLLOW-UP: prn   The above assessment and management plan was discussed with the patient. The patient verbalized understanding of and has agreed to the management plan. Patient is aware to call the clinic if symptoms persist or worsen. Patient is aware when to return to the clinic for a follow-up visit. Patient educated on when it is appropriate to go to the emergency department.     Rutherford Guys, MD Primary Care at [place], [place] [address] Ph.  [phone] Fax [phone]

## 2020-03-15 ENCOUNTER — Other Ambulatory Visit: Payer: Self-pay

## 2020-03-15 ENCOUNTER — Telehealth (INDEPENDENT_AMBULATORY_CARE_PROVIDER_SITE_OTHER): Payer: BC Managed Care – PPO | Admitting: Family Medicine

## 2020-03-15 ENCOUNTER — Ambulatory Visit: Payer: Self-pay

## 2020-03-15 DIAGNOSIS — U071 COVID-19: Secondary | ICD-10-CM

## 2020-03-15 MED ORDER — GUAIFENESIN-CODEINE 100-10 MG/5ML PO SYRP
5.0000 mL | ORAL_SOLUTION | Freq: Three times a day (TID) | ORAL | 0 refills | Status: DC | PRN
Start: 2020-03-15 — End: 2020-07-02

## 2020-03-15 NOTE — Telephone Encounter (Signed)
Spoke with pt. Made virtual appt for follow up and med request

## 2020-03-15 NOTE — Progress Notes (Signed)
Virtual Visit Note  I connected with patient on 03/15/20 at 600pm by phone and verified that I am speaking with the correct person using two identifiers. [name] is currently located at home and patient is currently with them during visit. The provider, Rutherford Guys, MD is located in their office at time of visit.  I discussed the limitations, risks, security and privacy concerns of performing an evaluation and management service by telephone and the availability of in person appointments. I also discussed with the patient that there may be a patient responsible charge related to this service. The patient expressed understanding and agreed to proceed.   I provided 6 minutes of non-face-to-face time during this encounter.  Chief Complaint  Patient presents with  . Cough    recent covid infection takes benzonatate daily, request something more for strong cough- coughing up white phlegm    HPI ? Tested positive for covid 19 on sept 1st Had monoclonal ab infusion sept 4th Had telemedicine visit 3 days ago - given tessalon pearls for cough Yesterday woke up with runny nose and nasal congestion Her cough is getting worse, dry, strong and gives her a headache Tessalon pearls and tylenol are helping but not enough No fever, SOB or wheezing   Allergies  Allergen Reactions  . Fish Allergy Nausea And Vomiting    Prior to Admission medications   Medication Sig Start Date End Date Taking? Authorizing Provider  Acetaminophen (TYLENOL PO) Take by mouth.   Yes [provider]  albuterol (VENTOLIN HFA) 108 (90 Base) MCG/ACT inhaler INHALE 2 PUFFS INTO THE LUNGS EVERY 6 HOURS AS NEEDED FOR WHEEZING OR SHORTNESS OF BREATH 12/28/19  Yes Rutherford Guys, MD  benzonatate (TESSALON) 100 MG capsule Take 1-2 capsules (100-200 mg total) by mouth 3 (three) times daily as needed for cough. 03/12/20  Yes Rutherford Guys, MD  fexofenadine (ALLEGRA) 180 MG tablet Take 1 tablet (180 mg  total) by mouth daily. 10/27/19  Yes Rutherford Guys, MD  lisinopril (ZESTRIL) 20 MG tablet TAKE 1 TABLET(20 MG) BY MOUTH DAILY 11/30/19  Yes Rutherford Guys, MD  mometasone (ELOCON) 0.1 % cream Apply 1 application topically daily. 01/05/20  Yes Rutherford Guys, MD  montelukast (SINGULAIR) 10 MG tablet Take 1 tablet (10 mg total) by mouth at bedtime. 10/27/19  Yes Rutherford Guys, MD  omeprazole (PRILOSEC) 20 MG capsule Take 1 capsule (20 mg total) by mouth 2 (two) times daily before a meal. 03/06/19  Yes Rutherford Guys, MD    Past Medical History:  Diagnosis Date  . Adnexal mass    Left  . Allergy    pollen allergy  . Asthma    only in Spring- allergic to pollens  . Complication of anesthesia    had a lot of bleeding and blood clots from vagina after surgery in [place]. [place] over 15 years ago, had to be readmitted to hospital, no blood transfusion was necessary  . GERD (gastroesophageal reflux disease)   . Headache    occ  . Hypertension    Not taking any medication at this time, Was taking Lisinopril 10 mg but has not had any for 1 month ran out of prescription, didnt have a PCP until 12/2017  . Poison ivy 12/2017  . SUI (stress urinary incontinence, female)   . Vitamin D deficiency     Past Surgical History:  Procedure Laterality Date  . CESAREAN SECTION    . LAPAROSCOPIC TUBAL  LIGATION Bilateral 08/22/2012   Procedure: LAPAROSCOPIC TUBAL LIGATION;  Surgeon: Woodroe Mode, MD;  Location: [facility];  Service: Gynecology;  Laterality: Bilateral;  IUD removal  . LIPOSUCTION TRUNK  2010  . OTHER SURGICAL HISTORY     Had a surgery in [place]. [place] unknown name,  . REDUCTION MAMMAPLASTY  2010  . ROBOTIC ASSISTED LAPAROSCOPIC LYSIS OF ADHESION  01/03/2018   Procedure: XI ROBOTIC ASSISTED LAPAROSCOPIC LYSIS OF ADHESIONS BETWEEN OMENTUM AND ABDOMINAL WALL;  Surgeon: Princess Bruins, MD;  Location: [facility];  Service: Gynecology;;  . UPPER GASTROINTESTINAL ENDOSCOPY  2019    Social  History   Tobacco Use  . Smoking status: Never Smoker  . Smokeless tobacco: Never Used  Substance Use Topics  . Alcohol use: No    Family History  Problem Relation Age of Onset  . Heart disease Father   . Diabetes Sister   . Hypertension Sister   . Hypertension Brother   . Liver disease Mother   . Breast cancer Maternal Aunt   . Colon cancer Neg Hx   . Esophageal cancer Neg Hx   . Colon polyps Neg Hx   . Rectal cancer Neg Hx   . Stomach cancer Neg Hx     ROS Per hpi  Objective  Vitals as reported by the patient: none  AAOx3, NAD Speaking in full sentences comfortably   ASSESSMENT and PLAN  1. COVID-19 virus infection Discussed supportive measures and OTC meds. rx for cheritussin given if not better with previous recommendations  Other orders - guaiFENesin-codeine (ROBITUSSIN AC) 100-10 MG/5ML syrup; Take 5 mLs by mouth 3 (three) times daily as needed for cough.  FOLLOW-UP: prn   The above assessment and management plan was discussed with the patient. The patient verbalized understanding of and has agreed to the management plan. Patient is aware to call the clinic if symptoms persist or worsen. Patient is aware when to return to the clinic for a follow-up visit. Patient educated on when it is appropriate to go to the emergency department.     Rutherford Guys, MD Primary Care at [facility] [address], [place] [address] Ph.  [phone] Fax [phone]

## 2020-03-15 NOTE — Telephone Encounter (Signed)
Patient's concern/request has been addressed 

## 2020-03-15 NOTE — Telephone Encounter (Signed)
Patient called and says her cough is getting worse that she's coughing up white phlegm and it's causing her chest to be tight when she coughs. She says she doesn't have chest pain, no fever, no SOB. She says overall she's feeling better from the COVID, but the cough is not that better. She says she stills feel weak, no appetite, fatigue and still has the cough. She says the pills Dr. Pamella Pert gave her at the last visit on 03/12/20 is not helping her at all. I placed patient on hold and called the office. I spoke to [place], [facility] who asked to speak to the patient. The call was connected successfully.  Reason for Disposition . [1] PERSISTING SYMPTOMS OF COVID-19 AND [2] symptoms WORSE  Answer Assessment - Initial Assessment Questions 1. COVID-19 ONSET: "When did the symptoms of COVID-19 first start?"     Last 03/03/20 2. DIAGNOSIS CONFIRMATION: "How were you diagnosed?" (e.g., COVID-19 oral or nasal viral test; COVID-19 antibody test; doctor visit)     Nasal test was done 3. MAIN SYMPTOM:  "What is your main concern or symptom right now?" (e.g., breathing difficulty, cough, fatigue. loss of smell)     Coughing up white phlegm 4. SYMPTOM ONSET: "When did the cough start?"     More than 5 days 5. BETTER-SAME-WORSE: "Are you getting better, staying the same, or getting worse over the last 1 to 2 weeks?"     Better 6. RECENT MEDICAL VISIT: "Have you been seen by a healthcare provider (doctor, NP, PA) for these persisting COVID-19 symptoms?" If Yes, ask: "When were you seen?" (e.g., date)     Yes on 03/12/20 7. COUGH: "Do you have a cough?" If Yes, ask: "How bad is the cough?"       Yes, sometimes it's bad and chest tightness 8. FEVER: "Do you have a fever?" If Yes, ask: "What is your temperature, how was it measured, and when did it start?"     Not now, last 98.7 9. BREATHING DIFFICULTY: "Are you having any trouble breathing?" If Yes, ask: "How bad is your breathing?" (e.g., mild, moderate, severe)    -  MILD: No SOB at rest, mild SOB with walking, speaks normally in sentences, can lay down, no retractions, pulse < 100.    - MODERATE: SOB at rest, SOB with minimal exertion and prefers to sit, cannot lie down flat, speaks in phrases, mild retractions, audible wheezing, pulse 100-120.    - SEVERE: Very SOB at rest, speaks in single words, struggling to breathe, sitting hunched forward, retractions, pulse > 120       Normal breathing, but if take a deep breath that makes me cough 10. HIGH RISK DISEASE: "Do you have any chronic medical problems?" (e.g., asthma, heart or lung disease, weak immune system, obesity, etc.)       Yes, asthma 11. PREGNANCY: "Is there any chance you are pregnant?" "When was your last menstrual period?"       No 12. OTHER SYMPTOMS: "Do you have any other symptoms?"  (e.g., fatigue, headache, muscle pain, weakness)       Headache a little, gets worse with coughing; no appetite, fatigue, weakness  Protocols used: CORONAVIRUS (COVID-19) PERSISTING SYMPTOMS FOLLOW-UP CALL-A-AH

## 2020-03-15 NOTE — Patient Instructions (Signed)
° ° ° °  If you have lab work done today you will be contacted with your lab results within the next 2 weeks.  If you have not heard from us then please contact us. The fastest way to get your results is to register for My Chart. ° ° °IF you received an x-ray today, you will receive an invoice from [facility]. Please contact [facility] at [phone] with questions or concerns regarding your invoice.  ° °IF you received labwork today, you will receive an invoice from [facility]. Please contact [facility] at [phone] with questions or concerns regarding your invoice.  ° °Our billing staff will not be able to assist you with questions regarding bills from these companies. ° °You will be contacted with the lab results as soon as they are available. The fastest way to get your results is to activate your My Chart account. Instructions are located on the last page of this paperwork. If you have not heard from us regarding the results in 2 weeks, please contact this office. °  ° ° ° °

## 2020-03-15 NOTE — Telephone Encounter (Signed)
Patient states has ongoing chest congestion and cough w/ white phlegm from recent covid infection. She is taking tylenol/ acetaminophen and is requesting something to help her cough and chest congestion. She is also taking benzonatate for cough once daily. She has dayquil and nyquil on hand and asks if she should take them for her cough. Please advise

## 2020-04-11 ENCOUNTER — Ambulatory Visit
Admission: EM | Admit: 2020-04-11 | Discharge: 2020-04-11 | Disposition: A | Discharge status: Home / Self Care | Payer: BC Managed Care – PPO | Attending: Emergency Medicine | Admitting: Emergency Medicine

## 2020-04-11 ENCOUNTER — Other Ambulatory Visit: Payer: Self-pay

## 2020-04-11 DIAGNOSIS — N390 Urinary tract infection, site not specified: Secondary | ICD-10-CM | POA: Diagnosis present

## 2020-04-11 LAB — POCT URINALYSIS DIP (MANUAL ENTRY)
Bilirubin, UA: NEGATIVE
Glucose, UA: NEGATIVE mg/dL
Ketones, POC UA: NEGATIVE mg/dL
Nitrite, UA: POSITIVE — AB
Protein Ur, POC: 100 mg/dL — AB
Spec Grav, UA: 1.01 (ref 1.010–1.025)
Urobilinogen, UA: 0.2 E.U./dL
pH, UA: 6 (ref 5.0–8.0)

## 2020-04-11 MED ORDER — CEPHALEXIN 500 MG PO CAPS: 500.0000 mg | ORAL_CAPSULE | Freq: Two times a day (BID) | ORAL | 0 refills | Status: AC

## 2020-04-11 NOTE — Discharge Instructions (Signed)
Urine showed evidence of infection. We are treating you with keflex, twice daily for 1 week. Be sure to take full course. Stay hydrated- urine should be pale yellow to clear. May continue azo for relief of burning while infection is being cleared.   Please return or follow up with your primary provider if symptoms not improving with treatment. Please return sooner if you have worsening of symptoms or develop fever, nausea, vomiting, abdominal pain, back pain, lightheadedness, dizziness.

## 2020-04-11 NOTE — ED Provider Notes (Signed)
[facility]    CSN: [id number] Arrival date & time: 04/11/20  1526      History   Chief Complaint Chief Complaint  Patient presents with  . Urinary Tract Infection    Painful Urination    HPI [name] is a 49 y.o. female presenting today for evaluation of possible UTI.  Patient reports that for the past 2 days she has had dysuria urinary frequency and hematuria.  Using over-the-counter Azo for relief of discomfort.  Denies vaginal discharge itching or irritation.  Denies fever nausea or vomiting.  Has had some slight left lower abdominal discomfort and back pain.  HPI  Past Medical History:  Diagnosis Date  . Adnexal mass    Left  . Allergy    pollen allergy  . Asthma    only in Spring- allergic to pollens  . Complication of anesthesia    had a lot of bleeding and blood clots from vagina after surgery in [place]. [place] over 15 years ago, had to be readmitted to hospital, no blood transfusion was necessary  . GERD (gastroesophageal reflux disease)   . Headache    occ  . Hypertension    Not taking any medication at this time, Was taking Lisinopril 10 mg but has not had any for 1 month ran out of prescription, didnt have a PCP until 12/2017  . Poison ivy 12/2017  . SUI (stress urinary incontinence, female)   . Vitamin D deficiency     Patient Active Problem List   Diagnosis Date Noted  . H/O gestational diabetes mellitus, not currently pregnant 02/04/2018  . S/P hysterectomy 02/04/2018  . Pelvic mass in female 05/21/2016  . Arthralgia 05/08/2016  . Intractable cluster headache syndrome 05/08/2016  . Menopausal syndrome (hot flashes) 05/08/2016    Past Surgical History:  Procedure Laterality Date  . CESAREAN SECTION    . LAPAROSCOPIC TUBAL LIGATION Bilateral 08/22/2012   Procedure: LAPAROSCOPIC TUBAL LIGATION;  Surgeon: Woodroe Mode, MD;  Location: [facility];  Service: Gynecology;  Laterality: Bilateral;  IUD removal  . LIPOSUCTION TRUNK  2010    . OTHER SURGICAL HISTORY     Had a surgery in [place]. [place] unknown name,  . REDUCTION MAMMAPLASTY  2010  . ROBOTIC ASSISTED LAPAROSCOPIC LYSIS OF ADHESION  01/03/2018   Procedure: XI ROBOTIC ASSISTED LAPAROSCOPIC LYSIS OF ADHESIONS BETWEEN OMENTUM AND ABDOMINAL WALL;  Surgeon: Princess Bruins, MD;  Location: [facility];  Service: Gynecology;;  . UPPER GASTROINTESTINAL ENDOSCOPY  2019    OB History    Gravida  4   Para  3   Term  3   Preterm  0   AB  1   Living  3     SAB  0   TAB  1   Ectopic  0   Multiple  0   Live Births               Home Medications    Prior to Admission medications   Medication Sig Start Date End Date Taking? Authorizing Provider  Acetaminophen (TYLENOL PO) Take by mouth.   Yes [provider]  albuterol (VENTOLIN HFA) 108 (90 Base) MCG/ACT inhaler INHALE 2 PUFFS INTO THE LUNGS EVERY 6 HOURS AS NEEDED FOR WHEEZING OR SHORTNESS OF BREATH 12/28/19  Yes Rutherford Guys, MD  fexofenadine (ALLEGRA) 180 MG tablet Take 1 tablet (180 mg total) by mouth daily. 10/27/19  Yes Rutherford Guys, MD  guaiFENesin-codeine [facility]) 100-10 MG/5ML syrup  Take 5 mLs by mouth 3 (three) times daily as needed for cough. 03/15/20  Yes Rutherford Guys, MD  lisinopril (ZESTRIL) 20 MG tablet TAKE 1 TABLET(20 MG) BY MOUTH DAILY 11/30/19  Yes Rutherford Guys, MD  omeprazole (PRILOSEC) 20 MG capsule Take 1 capsule (20 mg total) by mouth 2 (two) times daily before a meal. 03/06/19  Yes Rutherford Guys, MD  cephALEXin (KEFLEX) 500 MG capsule Take 1 capsule (500 mg total) by mouth 2 (two) times daily for 7 days. 04/11/20 04/18/20  Arbie Blankley C, PA-C  montelukast (SINGULAIR) 10 MG tablet Take 1 tablet (10 mg total) by mouth at bedtime. 10/27/19 04/11/20  Rutherford Guys, MD    Family History Family History  Problem Relation Age of Onset  . Heart disease Father   . Diabetes Sister   . Hypertension Sister   . Hypertension Brother   . Liver disease Mother    . Breast cancer Maternal Aunt   . Colon cancer Neg Hx   . Esophageal cancer Neg Hx   . Colon polyps Neg Hx   . Rectal cancer Neg Hx   . Stomach cancer Neg Hx     Social History Social History   Tobacco Use  . Smoking status: Never Smoker  . Smokeless tobacco: Never Used  Vaping Use  . Vaping Use: Never used  Substance Use Topics  . Alcohol use: No  . Drug use: No     Allergies   Fish allergy   Review of Systems Review of Systems  Constitutional: Negative for fever.  Respiratory: Negative for shortness of breath.   Cardiovascular: Negative for chest pain.  Gastrointestinal: Negative for abdominal pain, diarrhea, nausea and vomiting.  Genitourinary: Positive for dysuria, frequency and hematuria. Negative for flank pain, genital sores, menstrual problem, vaginal bleeding, vaginal discharge and vaginal pain.  Musculoskeletal: Negative for back pain.  Skin: Negative for rash.  Neurological: Negative for dizziness, light-headedness and headaches.     Physical Exam Triage Vital Signs ED Triage Vitals  Enc Vitals Group     BP 04/11/20 1556 112/74     Pulse Rate 04/11/20 1556 76     Resp 04/11/20 1556 17     Temp 04/11/20 1556 98.3 F (36.8 C)     Temp Source 04/11/20 1556 Oral     SpO2 04/11/20 1556 98 %     Weight --      Height --      Head Circumference --      Peak Flow --      Pain Score 04/11/20 1557 5     Pain Loc --      Pain Edu? --      Excl. in [place]? --    No data found.  Updated Vital Signs BP 112/74 (BP Location: Left Arm)   Pulse 76   Temp 98.3 F (36.8 C) (Oral)   Resp 17   SpO2 98%   Visual Acuity Right Eye Distance:   Left Eye Distance:   Bilateral Distance:    Right Eye Near:   Left Eye Near:    Bilateral Near:     Physical Exam Vitals and nursing note reviewed.  Constitutional:      Appearance: She is well-developed.     Comments: No acute distress  HENT:     Head: Normocephalic and atraumatic.     Nose: Nose normal.   Eyes:     Conjunctiva/sclera: Conjunctivae normal.  Cardiovascular:  Rate and Rhythm: Normal rate.  Pulmonary:     Effort: Pulmonary effort is normal. No respiratory distress.  Abdominal:     General: There is no distension.     Comments: Soft, nondistended, mild tenderness to palpation in left lower quadrant, and negative rebound, negative Rovsing, negative McBurney's  Musculoskeletal:        General: Normal range of motion.     Cervical back: Neck supple.  Skin:    General: Skin is warm and dry.  Neurological:     Mental Status: She is alert and oriented to person, place, and time.      UC Treatments / Results  Labs (all labs ordered are listed, but only abnormal results are displayed) Labs Reviewed  POCT URINALYSIS DIP (MANUAL ENTRY) - Abnormal; Notable for the following components:      Result Value   Color, UA orange (*)    Clarity, UA cloudy (*)    Blood, UA large (*)    Protein Ur, POC =100 (*)    Nitrite, UA Positive (*)    Leukocytes, UA Large (3+) (*)    All other components within normal limits  URINE CULTURE    EKG   Radiology No results found.  Procedures Procedures (including critical care time)  Medications Ordered in UC Medications - No data to display  Initial Impression / Assessment and Plan / UC Course  I have reviewed the triage vital signs and the nursing notes.  Pertinent labs & imaging results that were available during my care of the patient were reviewed by me and considered in my medical decision making (see chart for details).     UA consistent with UTI, treating with Keflex twice daily x1 week.  Urine culture pending.  Push fluids.  Monitor for resolution of symptoms.  Discussed strict return precautions. Patient verbalized understanding and is agreeable with plan.  Final Clinical Impressions(s) / UC Diagnoses   Final diagnoses:  Lower urinary tract infection, acute     Discharge Instructions     Urine showed  evidence of infection. We are treating you with keflex, twice daily for 1 week. Be sure to take full course. Stay hydrated- urine should be pale yellow to clear. May continue azo for relief of burning while infection is being cleared.   Please return or follow up with your primary provider if symptoms not improving with treatment. Please return sooner if you have worsening of symptoms or develop fever, nausea, vomiting, abdominal pain, back pain, lightheadedness, dizziness.   ED Prescriptions    Medication Sig Dispense Auth. Provider   cephALEXin (KEFLEX) 500 MG capsule Take 1 capsule (500 mg total) by mouth 2 (two) times daily for 7 days. 14 capsule Jameson Tormey, [place] C, PA-C     PDMP not reviewed this encounter.   Janith Lima, [place] 04/11/20 1621

## 2020-04-11 NOTE — ED Triage Notes (Signed)
Pt states for 2 days she has experienced urinary frequency and burning. Pt states there is some pain and burning even when not urinating. Pt is aox4 and ambulatory.

## 2020-04-13 LAB — URINE CULTURE: Culture: <10000 — AB

## 2020-04-26 ENCOUNTER — Ambulatory Visit: Payer: BC Managed Care – PPO | Admitting: Family Medicine

## 2020-07-02 ENCOUNTER — Encounter: Payer: Self-pay | Admitting: Allergy

## 2020-07-02 ENCOUNTER — Ambulatory Visit (INDEPENDENT_AMBULATORY_CARE_PROVIDER_SITE_OTHER): Payer: BC Managed Care – PPO | Admitting: Allergy

## 2020-07-02 ENCOUNTER — Other Ambulatory Visit: Payer: Self-pay

## 2020-07-02 VITALS — BP 118/80 | HR 72 | Temp 97.6°F | Resp 16 | Ht 62.68 in | Wt 157.0 lb

## 2020-07-02 DIAGNOSIS — H1013 Acute atopic conjunctivitis, bilateral: Secondary | ICD-10-CM

## 2020-07-02 DIAGNOSIS — J45909 Unspecified asthma, uncomplicated: Secondary | ICD-10-CM | POA: Diagnosis not present

## 2020-07-02 DIAGNOSIS — T781XXD Other adverse food reactions, not elsewhere classified, subsequent encounter: Secondary | ICD-10-CM | POA: Diagnosis not present

## 2020-07-02 DIAGNOSIS — T7819XD Other adverse food reactions, not elsewhere classified, subsequent encounter: Secondary | ICD-10-CM

## 2020-07-02 DIAGNOSIS — J3089 Other allergic rhinitis: Secondary | ICD-10-CM | POA: Insufficient documentation

## 2020-07-02 DIAGNOSIS — R12 Heartburn: Secondary | ICD-10-CM | POA: Diagnosis not present

## 2020-07-02 DIAGNOSIS — L299 Pruritus, unspecified: Secondary | ICD-10-CM | POA: Insufficient documentation

## 2020-07-02 MED ORDER — EPINEPHRINE 0.3 MG/0.3ML IJ SOAJ: 0.3000 mg | INTRAMUSCULAR | 1 refills | Status: AC | PRN

## 2020-07-02 MED ORDER — BUDESONIDE-FORMOTEROL FUMARATE 80-4.5 MCG/ACT IN AERO: 2.0000 | INHALATION_SPRAY | Freq: Two times a day (BID) | RESPIRATORY_TRACT | 5 refills | Status: AC

## 2020-07-02 MED ORDER — OLOPATADINE HCL 0.2 % OP SOLN: 1.0000 [drp] | Freq: Every day | OPHTHALMIC | 5 refills | Status: AC | PRN

## 2020-07-02 NOTE — Assessment & Plan Note (Signed)
Heartburn and used to be on PPI.  Gave handout of lifestyle modification.

## 2020-07-02 NOTE — Assessment & Plan Note (Addendum)
Itching without rash.  Most common cause of pruritus during this time of the year is xerosis.  Stressed importance of moisturizing skin and proper skin care.  If no improvement, may need further work up.

## 2020-07-02 NOTE — Progress Notes (Signed)
New Patient Note  RE: [name] MRN: [id number] DOB: March 17, 1971 Date of Office Visit: 07/02/2020  Referring provider: No ref. provider found Primary care provider: Jacelyn Pi, Lilia Argue, MD  Chief Complaint: Asthma and Allergic Rhinitis  (Cat, dog, dust)  History of Present Illness: I had the pleasure of seeing [name] for initial evaluation at the [facility] on 07/02/2020. She is a 49 y.o. female, who is self-referred here for the evaluation of allergic rhinitis and asthma.  Rhinitis: She reports symptoms of watery eyes, itchy throat, sneezing, coughing, rhinorrhea, nasal congestion. Symptoms have been going on for 5 years. The symptoms are present all year around. Other triggers include exposure to cat, dog, dust, bird. Anosmia: no. Headache: sometimes. She has used Human resources officer, zyrtec, Flonase with some improvement in symptoms. Sinus infections: no. Previous work up includes: none. Previous ENT evaluation: no. Previous sinus imaging: no. History of nasal polyps: no. Last eye exam: last year. History of reflux: used to take omeprazole in the past.  Asthma: She reports symptoms of chest tightness, shortness of breath, coughing, wheezing for 5 years. Current medications include albuterol prn which help. She reports not using aerochamber with inhalers. She tried the following inhalers: daughter's Symbicort which seems to help more than albuterol. Main triggers are allergies, dust, fumes/smoke. In the last month, frequency of symptoms: 5x/week. Frequency of nocturnal symptoms: 0x/month. Frequency of SABA use: 5x/week. Interference with physical activity: no. Sleep is undisturbed. In the last 12 months, emergency room visits/urgent care visits/doctor office visits or hospitalizations due to respiratory issues: no. In the last 12 months, oral steroids courses: no. Lifetime history of hospitalization for respiratory issues: no. Prior intubations: no. History  of pneumonia: no. She was not evaluated by allergist/pulmonologist in the past. Smoking exposure: no. Up to date with flu vaccine: no. Up to date with COVID-19 vaccine: no.  Patient had COVID-19 in September.   Assessment and Plan: [name] is a 49 y.o. female with: Asthma, not well controlled Episodes of chest tightness, shortness of breath, coughing, wheezing for 5 years. Using albuterol 5 times per week with good benefit. Triggers include allergies.   Today's spirometry showed: restrictive disease with 5% improvement in FEV1 post bronchodilator treatment. Clinically feeling slightly improved.  . Daily controller medication(s): start Symbicort 76mcg 2 puffs twice a day with spacer and rinse mouth afterwards. Marland Kitchen Spacer given and demonstrated proper use with inhaler. Patient understood technique and all questions/concerned were addressed.  . May use albuterol rescue inhaler 2 puffs every 4 to 6 hours as needed for shortness of breath, chest tightness, coughing, and wheezing. May use albuterol rescue inhaler 2 puffs 5 to 15 minutes prior to strenuous physical activities. Monitor frequency of use.  . Repeat spirometry at next visit.  Other allergic rhinitis Perennial rhinoconjunctivitis symptoms for 5 years.  Tried Allegra, Zyrtec and Flonase with some benefit.  No prior allergy/ENT evaluation.  Today's skin testing showed:Positive to ragweed, trees, mold, cat, dog.   Start environmental control measures as below.  May use over the counter antihistamines such as Zyrtec (cetirizine), Claritin (loratadine), Allegra (fexofenadine), or Xyzal (levocetirizine) daily as needed.  May use Flonase (fluticasone) nasal spray 1 spray per nostril twice a day as needed for nasal congestion.   Nasal saline spray (i.e., Simply Saline) or nasal saline lavage (i.e., NeilMed) is recommended as needed and prior to medicated nasal sprays.  May use olopatadine eye drops 0.2% once a day as needed for itchy/watery  eyes.  Read about allergy injections.  Allergic conjunctivitis of both eyes  See assessment and plan as above for allergic rhinitis.  Adverse reaction to food, subsequent encounter Fish caused vomiting and abdominal pains. Tolerates shellfish. Does not eat mollusks.  Today's skin testing showed: Negative to finned fish and mollusks.   Continue to avoid finned fish. Food allergen skin testing has excellent negative predictive value however there is still a small chance that the allergy exists. Therefore, we will investigate further with serum specific IgE levels and, if negative then schedule for open graded oral food challenge. A laboratory order form has been provided for serum specific IgE against finned fish. Until the food allergy has been definitively ruled out, the patient is to continue meticulous avoidance of finned fish and have access to epinephrine autoinjector 2 pack.  For mild symptoms you can take over the counter antihistamines such as Benadryl and monitor symptoms closely. If symptoms worsen or if you have severe symptoms including breathing issues, throat closure, significant swelling, whole body hives, severe diarrhea and vomiting, lightheadedness then inject epinephrine and seek immediate medical care afterwards.  Emergency action plan given.  Pruritus Itching without rash.  Most common cause of pruritus during this time of the year is xerosis.  Stressed importance of moisturizing skin and proper skin care.  If no improvement, may need further work up.   Heartburn Heartburn and used to be on PPI.  Gave handout of lifestyle modification.  Return in about 3 months (around 09/30/2020).  Meds ordered this encounter  Medications  . budesonide-formoterol (SYMBICORT) 80-4.5 MCG/ACT inhaler    Sig: Inhale 2 puffs into the lungs in the morning and at bedtime. with spacer and rinse mouth afterwards.    Dispense:  1 each    Refill:  5  . Olopatadine HCl 0.2 % SOLN     Sig: Apply 1 drop to eye daily as needed (itchy/watery eyes).    Dispense:  2.5 mL    Refill:  5  . EPINEPHrine (AUVI-Q) 0.3 mg/0.3 mL IJ SOAJ injection    Sig: Inject 0.3 mg into the muscle as needed for anaphylaxis.    Dispense:  1 each    Refill:  1    Lab Orders     Allergen Profile, Food-Fish  Other allergy screening: Food allergy: yes  Fish causes abdominal pain and vomiting.  Okay with shellfish. Does not eat mollusks. Medication allergy: no Hymenoptera allergy: no Urticaria: no Eczema:no  Complaining of some pruritus. But no rash.  History of recurrent infections suggestive of immunodeficency: no  Diagnostics: Spirometry:  Tracings reviewed. Her effort: Good reproducible efforts. FVC: 2.08L FEV1: 2.01L, 95% predicted FEV1/FVC ratio: 97% Interpretation: Spirometry consistent with possible restrictive disease with 5% improvement in FEV1 post bronchodilator treatment. Clinically feeling slightly improved.  Please see scanned spirometry results for details.  Skin Testing: Environmental allergy panel and select foods. Positive to ragweed, trees, mold, cat, dog.  Negative to finned fish and mollusks.  Results discussed with patient/family.  Airborne Adult Perc - 07/02/20 1455    Time Antigen Placed 1455    Allergen Manufacturer Waynette Buttery    Location Back    Number of Test 59    1. Control-Buffer 50% Glycerol Negative    2. Control-Histamine 1 mg/ml 2+    3. Albumin saline Negative    4. Bahia Negative    5. French Southern Territories Negative    6. Johnson Negative    7. Kentucky Blue Negative    8. Meadow Fescue Negative  9. Perennial Rye Negative    10. Sweet Vernal Negative    11. Timothy Negative    12. Cocklebur Negative    13. Burweed Marshelder Negative    14. Ragweed, short Negative    15. Ragweed, Giant Negative    16. Plantain,  English Negative    17. Lamb's Quarters Negative    18. Sheep Sorrell Negative    19. Rough Pigweed Negative    20. Marsh Elder, Rough  Negative    21. Mugwort, Common Negative    22. Ash mix 2+    23. Birch mix 3+    24. Beech American Negative    25. Box, Elder Negative    26. Cedar, red Negative    27. Cottonwood, [place] Negative    28. Elm mix Negative    29. Hickory 4+    30. Maple mix Negative    31. Oak, [place] mix 3+    32. Pecan Pollen 3+    33. Pine mix Negative    34. Sycamore Eastern Negative    35. [place], Black Pollen 2+    36. Alternaria alternata Negative    37. Cladosporium Herbarum Negative    38. Aspergillus mix Negative    39. Penicillium mix Negative    40. Bipolaris sorokiniana (Helminthosporium) Negative    41. Drechslera spicifera (Curvularia) Negative    42. Mucor plumbeus Negative    43. Fusarium moniliforme Negative    44. Aureobasidium pullulans (pullulara) Negative    45. Rhizopus oryzae Negative    46. Botrytis cinera Negative    47. Epicoccum nigrum Negative    48. Phoma betae Negative    49. Candida Albicans Negative    50. Trichophyton mentagrophytes Negative    51. Mite, D Farinae  5,000 AU/ml Negative    52. Mite, D Pteronyssinus  5,000 AU/ml Negative    53. Cat Hair 10,000 BAU/ml 2+    54.  Dog Epithelia 2+    55. Mixed Feathers Negative    56. Horse Epithelia Negative    57. Cockroach, German Negative    58. Mouse Negative    59. Tobacco Leaf Negative          Intradermal - 07/02/20 1522    Time Antigen Placed 1522    Allergen Manufacturer Other    Location Arm    Number of Test 12    Control Negative    Guatem[place] Negative    Johnson Negative    7 Grass Negative    Ragweed mix 2+    Weed mix Negative    Mold 1 Negative    Mold 2 2+    Mold 3 Negative    Mold 4 2+    Cockroach Negative    Mite mix Negative          Food Adult Perc - 07/02/20 1400    Time Antigen Placed 1454    Allergen Manufacturer Lavella Hammock    Location Back    Number of allergen test 10    9. Fish Mix Negative    18. Catfish Negative    19. Bass Negative    20. Trout Negative     21. Tuna Negative    22. Salmon Negative    23. Flounder Negative    24. Codfish Negative    28. Oyster Negative    29. Scallops Negative           Past Medical History: Patient Active Problem List   Diagnosis Date Noted  .  Other allergic rhinitis 07/02/2020  . Allergic conjunctivitis of both eyes 07/02/2020  . Adverse reaction to food, subsequent encounter 07/02/2020  . Heartburn 07/02/2020  . Asthma, not well controlled 07/02/2020  . Pruritus 07/02/2020  . H/O gestational diabetes mellitus, not currently pregnant 02/04/2018  . S/P hysterectomy 02/04/2018  . Pelvic mass in female 05/21/2016  . Arthralgia 05/08/2016  . Intractable cluster headache syndrome 05/08/2016  . Menopausal syndrome (hot flashes) 05/08/2016   Past Medical History:  Diagnosis Date  . Adnexal mass    Left  . Allergy    pollen allergy  . Asthma    only in Spring- allergic to pollens  . Complication of anesthesia    had a lot of bleeding and blood clots from vagina after surgery in [place]. [place] over 15 years ago, had to be readmitted to hospital, no blood transfusion was necessary  . GERD (gastroesophageal reflux disease)   . Headache    occ  . Hypertension    Not taking any medication at this time, Was taking Lisinopril 10 mg but has not had any for 1 month ran out of prescription, didnt have a PCP until 12/2017  . Poison ivy 12/2017  . SUI (stress urinary incontinence, female)   . Vitamin D deficiency    Past Surgical History: Past Surgical History:  Procedure Laterality Date  . CESAREAN SECTION    . LAPAROSCOPIC TUBAL LIGATION Bilateral 08/22/2012   Procedure: LAPAROSCOPIC TUBAL LIGATION;  Surgeon: Woodroe Mode, MD;  Location: [facility];  Service: Gynecology;  Laterality: Bilateral;  IUD removal  . LIPOSUCTION TRUNK  2010  . OTHER SURGICAL HISTORY     Had a surgery in [place]. [place] unknown name,  . REDUCTION MAMMAPLASTY  2010  . ROBOTIC ASSISTED LAPAROSCOPIC LYSIS OF ADHESION  01/03/2018    Procedure: XI ROBOTIC ASSISTED LAPAROSCOPIC LYSIS OF ADHESIONS BETWEEN OMENTUM AND ABDOMINAL WALL;  Surgeon: Princess Bruins, MD;  Location: [facility];  Service: Gynecology;;  . UPPER GASTROINTESTINAL ENDOSCOPY  2019   Medication List:  Current Outpatient Medications  Medication Sig Dispense Refill  . Acetaminophen (TYLENOL PO) Take by mouth.    Marland Kitchen albuterol (VENTOLIN HFA) 108 (90 Base) MCG/ACT inhaler INHALE 2 PUFFS INTO THE LUNGS EVERY 6 HOURS AS NEEDED FOR WHEEZING OR SHORTNESS OF BREATH 18 g 5  . budesonide-formoterol (SYMBICORT) 80-4.5 MCG/ACT inhaler Inhale 2 puffs into the lungs in the morning and at bedtime. with spacer and rinse mouth afterwards. 1 each 5  . EPINEPHrine (AUVI-Q) 0.3 mg/0.3 mL IJ SOAJ injection Inject 0.3 mg into the muscle as needed for anaphylaxis. 1 each 1  . lisinopril (ZESTRIL) 20 MG tablet TAKE 1 TABLET(20 MG) BY MOUTH DAILY 90 tablet 1  . Olopatadine HCl 0.2 % SOLN Apply 1 drop to eye daily as needed (itchy/watery eyes). 2.5 mL 5  . omeprazole (PRILOSEC) 20 MG capsule Take 1 capsule (20 mg total) by mouth 2 (two) times daily before a meal. 60 capsule 3   No current facility-administered medications for this visit.   Allergies: Allergies  Allergen Reactions  . Fish Allergy Nausea And Vomiting   Social History: Social History   Socioeconomic History  . Marital status: Married    Spouse name: Not on file  . Number of children: 3  . Years of education: Not on file  . Highest education level: Not on file  Occupational History  . Not on file  Tobacco Use  . Smoking status: Never Smoker  . Smokeless tobacco: Never Used  Vaping Use  . Vaping Use: Never used  Substance and Sexual Activity  . Alcohol use: No  . Drug use: No  . Sexual activity: Yes    Partners: Male    Comment: 1ST intercourse- 43, partners-1, married- 32 yrs   Other Topics Concern  . Not on file  Social History Narrative  . Not on file   Social Determinants of Health   Financial  Resource Strain: Not on file  Food Insecurity: Not on file  Transportation Needs: Not on file  Physical Activity: Not on file  Stress: Not on file  Social Connections: Not on file   Lives in a house. Smoking: denies Occupation: Building surveyor HistoryFreight forwarder in the house: no Charity fundraiser in the family room: no Carpet in the bedroom: yes Heating: electric Cooling: central Pet: yes 1 dog x 10 yrs, 1 cat 2 yrs, 2 birds x 7 yrs - all in garage  Family History: Family History  Problem Relation Age of Onset  . Heart disease Father   . Diabetes Sister   . Hypertension Sister   . Asthma Sister   . Hypertension Brother   . Liver disease Mother   . Asthma Maternal Grandmother   . Breast cancer Maternal Aunt   . Colon cancer Neg Hx   . Esophageal cancer Neg Hx   . Colon polyps Neg Hx   . Rectal cancer Neg Hx   . Stomach cancer Neg Hx   . Allergic rhinitis Neg Hx   . Eczema Neg Hx   . Urticaria Neg Hx    Review of Systems  Constitutional: Negative for appetite change, chills, fever and unexpected weight change.  HENT: Positive for congestion. Negative for rhinorrhea.   Eyes: Negative for itching.  Respiratory: Positive for cough, chest tightness and shortness of breath. Negative for wheezing.   Cardiovascular: Negative for chest pain.  Gastrointestinal: Negative for abdominal pain.  Genitourinary: Negative for difficulty urinating.  Skin: Negative for rash.       pruritus  Allergic/Immunologic: Positive for environmental allergies.  Neurological: Negative for headaches.   Objective: BP 118/80 (BP Location: Left Arm, Patient Position: Sitting, Cuff Size: Normal)   Pulse 72   Temp 97.6 F (36.4 C) (Temporal)   Resp 16   Ht 5' 2.68" (1.592 m)   Wt 157 lb (71.2 kg)   SpO2 98%   BMI 28.10 kg/m  Body mass index is 28.1 kg/m. Physical Exam Vitals and nursing note reviewed.  Constitutional:      Appearance: Normal appearance. She is  well-developed.  HENT:     Head: Normocephalic and atraumatic.     Right Ear: Tympanic membrane and external ear normal.     Left Ear: Tympanic membrane and external ear normal.     Nose: Nose normal.     Mouth/Throat:     Mouth: Mucous membranes are moist.     Pharynx: Oropharynx is clear.  Eyes:     Conjunctiva/sclera: Conjunctivae normal.  Cardiovascular:     Rate and Rhythm: Normal rate and regular rhythm.     Heart sounds: Normal heart sounds. No murmur heard. No friction rub. No gallop.   Pulmonary:     Effort: Pulmonary effort is normal.     Breath sounds: Normal breath sounds. No wheezing, rhonchi or rales.  Musculoskeletal:     Cervical back: Neck supple.  Skin:    General: Skin is warm.     Findings: No rash.  Neurological:  Mental Status: She is alert and oriented to person, place, and time.  Psychiatric:        Behavior: Behavior normal.    The plan was reviewed with the patient/family, and all questions/concerned were addressed.  It was my pleasure to see [name] today and participate in her care. Please feel free to contact me with any questions or concerns.  Sincerely,  Wyline Mood, DO Allergy & Immunology  [facility] office: [phone] [facility] office: [phone]

## 2020-07-02 NOTE — Assessment & Plan Note (Signed)
   See assessment and plan as above for allergic rhinitis.  

## 2020-07-02 NOTE — Assessment & Plan Note (Addendum)
Fish caused vomiting and abdominal pains. Tolerates shellfish. Does not eat mollusks.  Today's skin testing showed: Negative to finned fish and mollusks.   Continue to avoid finned fish. Food allergen skin testing has excellent negative predictive value however there is still a small chance that the allergy exists. Therefore, we will investigate further with serum specific IgE levels and, if negative then schedule for open graded oral food challenge. A laboratory order form has been provided for serum specific IgE against finned fish. Until the food allergy has been definitively ruled out, the patient is to continue meticulous avoidance of finned fish and have access to epinephrine autoinjector 2 pack.  For mild symptoms you can take over the counter antihistamines such as Benadryl and monitor symptoms closely. If symptoms worsen or if you have severe symptoms including breathing issues, throat closure, significant swelling, whole body hives, severe diarrhea and vomiting, lightheadedness then inject epinephrine and seek immediate medical care afterwards.  Emergency action plan given.

## 2020-07-02 NOTE — Assessment & Plan Note (Signed)
Episodes of chest tightness, shortness of breath, coughing, wheezing for 5 years. Using albuterol 5 times per week with good benefit. Triggers include allergies.   Today's spirometry showed: restrictive disease with 5% improvement in FEV1 post bronchodilator treatment. Clinically feeling slightly improved.  . Daily controller medication(s): start Symbicort 2 puffs twice a day with spacer and rinse mouth afterwards. Marland Kitchen Spacer given and demonstrated proper use with inhaler. Patient understood technique and all questions/concerned were addressed.  . May use albuterol rescue inhaler 2 puffs every 4 to 6 hours as needed for shortness of breath, chest tightness, coughing, and wheezing. May use albuterol rescue inhaler 2 puffs 5 to 15 minutes prior to strenuous physical activities. Monitor frequency of use.  . Repeat spirometry at next visit.

## 2020-07-02 NOTE — Patient Instructions (Addendum)
Today's skin testing showed: Positive to ragweed, trees, mold, cat, dog.  Negative to finned fish and mollusks.   Environmental allergies  Start environmental control measures as below.  May use over the counter antihistamines such as Zyrtec (cetirizine), Claritin (loratadine), Allegra (fexofenadine), or Xyzal (levocetirizine) daily as needed.  May use Flonase (fluticasone) nasal spray 1 spray per nostril twice a day as needed for nasal congestion.   Nasal saline spray (i.e., Simply Saline) or nasal saline lavage (i.e., NeilMed) is recommended as needed and prior to medicated nasal sprays.  May use olopatadine eye drops 0.2% once a day as needed for itchy/watery eyes.  Read about allergy injections.  Asthma: . Daily controller medication(s): start Symbicort 2 puffs twice a day with spacer and rinse mouth afterwards. Marland Kitchen Spacer given and demonstrated proper use with inhaler. Patient understood technique and all questions/concerned were addressed.  . May use albuterol rescue inhaler 2 puffs every 4 to 6 hours as needed for shortness of breath, chest tightness, coughing, and wheezing. May use albuterol rescue inhaler 2 puffs 5 to 15 minutes prior to strenuous physical activities. Monitor frequency of use.  . Asthma control goals:  o Full participation in all desired activities (may need albuterol before activity) o Albuterol use two times or less a week on average (not counting use with activity) o Cough interfering with sleep two times or less a month o Oral steroids no more than once a year o No hospitalizations  Heartburn:  See below for lifestyle modifications.  Food allergy:  Continue to avoid finned fish. Food allergen skin testing has excellent negative predictive value however there is still a small chance that the allergy exists. Therefore, we will investigate further with serum specific IgE levels and, if negative then schedule for open graded oral food challenge. A  laboratory order form has been provided for serum specific IgE against finned fish. Until the food allergy has been definitively ruled out, the patient is to continue meticulous avoidance of finned fish and have access to epinephrine autoinjector 2 pack.  For mild symptoms you can take over the counter antihistamines such as Benadryl and monitor symptoms closely. If symptoms worsen or if you have severe symptoms including breathing issues, throat closure, significant swelling, whole body hives, severe diarrhea and vomiting, lightheadedness then inject epinephrine and seek immediate medical care afterwards.  Emergency action plan given.  Follow up in 3 months or sooner if needed.   Reducing Pollen Exposure . Pollen seasons: trees (spring), grass (summer) and ragweed/weeds (fall). Marland Kitchen Keep windows closed in your home and car to lower pollen exposure.  Lilian Kapur air conditioning in the bedroom and throughout the house if possible.  . Avoid going out in dry windy days - especially early morning. . Pollen counts are highest between 5 - 10 AM and on dry, hot and windy days.  . Save outside activities for late afternoon or after a heavy rain, when pollen levels are lower.  . Avoid mowing of grass if you have grass pollen allergy. Marland Kitchen Be aware that pollen can also be transported indoors on people and pets.  . Dry your clothes in an automatic dryer rather than hanging them outside where they might collect pollen.  . Rinse hair and eyes before bedtime. Pet Allergen Avoidance: . Contrary to popular opinion, there are no "hypoallergenic" breeds of dogs or cats. That is because people are not allergic to an animal's hair, but to an allergen found in the animal's saliva, dander (dead  skin flakes) or urine. Pet allergy symptoms typically occur within minutes. For some people, symptoms can build up and become most severe 8 to 12 hours after contact with the animal. People with severe allergies can experience  reactions in public places if dander has been transported on the pet owners' clothing. Marland Kitchen Keeping an animal outdoors is only a partial solution, since homes with pets in the yard still have higher concentrations of animal allergens. . Before getting a pet, ask your allergist to determine if you are allergic to animals. If your pet is already considered part of your family, try to minimize contact and keep the pet out of the bedroom and other rooms where you spend a great deal of time. . As with dust mites, vacuum carpets often or replace carpet with a hardwood floor, tile or linoleum. . High-efficiency particulate air (HEPA) cleaners can reduce allergen levels over time. . While dander and saliva are the source of cat and dog allergens, urine is the source of allergens from rabbits, hamsters, mice and [place] pigs; so ask a non-allergic family member to clean the animal's cage. . If you have a pet allergy, talk to your allergist about the potential for allergy immunotherapy (allergy shots). This strategy can often provide long-term relief. Mold Control . Mold and fungi can grow on a variety of surfaces provided certain temperature and moisture conditions exist.  . Outdoor molds grow on plants, decaying vegetation and soil. The major outdoor mold, Alternaria and Cladosporium, are found in very high numbers during hot and dry conditions. Generally, a late summer - fall peak is seen for common outdoor fungal spores. Rain will temporarily lower outdoor mold spore count, but counts rise rapidly when the rainy period ends. . The most important indoor molds are Aspergillus and Penicillium. Dark, humid and poorly ventilated basements are ideal sites for mold growth. The next most common sites of mold growth are the bathroom and the kitchen. Outdoor (Seasonal) Mold Control . Use air conditioning and keep windows closed. . Avoid exposure to decaying vegetation. Marland Kitchen Avoid leaf raking. . Avoid grain  handling. . Consider wearing a face mask if working in moldy areas.  Indoor (Perennial) Mold Control  . Maintain humidity below 50%. . Get rid of mold growth on hard surfaces with water, detergent and, if necessary, 5% bleach (do not mix with other cleaners). Then dry the area completely. If mold covers an area more than 10 square feet, consider hiring an indoor environmental professional. . For clothing, washing with soap and water is best. If moldy items cannot be cleaned and dried, throw them away. . Remove sources e.g. contaminated carpets. . Repair and seal leaking roofs or pipes. Using dehumidifiers in damp basements may be helpful, but empty the water and clean units regularly to prevent mildew from forming. All rooms, especially basements, bathrooms and kitchens, require ventilation and cleaning to deter mold and mildew growth. Avoid carpeting on concrete or damp floors, and storing items in damp areas.   Heartburn Heartburn is a type of pain or discomfort that can happen in the throat or chest. It is often described as a burning pain. It may also cause a bad, acid-like taste in the mouth. Heartburn may feel worse when you lie down or bend over. It may be worse at night. It may be caused by stomach contents that move back up (reflux) into the tube that connects the mouth with the stomach (esophagus). Follow these instructions at home: Eating and drinking  Avoid certain foods and drinks as told by your doctor. This may include: ? Coffee and tea (with or without caffeine). ? Drinks that have alcohol. ? Energy drinks and sports drinks. ? Carbonated drinks or sodas. ? Chocolate and cocoa. ? Peppermint and mint flavorings. ? Garlic and onions. ? Horseradish. ? Spicy and acidic foods, such as:  Peppers.  Chili powder and curry powder.  Vinegar.  Hot sauces and BBQ sauce. ? Citrus fruit juices and citrus fruits, such as:  Oranges.  Lemons.  Limes. ? Tomato-based foods, such  as:  Red sauce and pizza with red sauce.  Chili.  Salsa. ? Fried and fatty foods, such as:  Donuts.  [place] fries and potato chips.  High-fat dressings. ? High-fat meats, such as:  Hot dogs and sausage.  Rib eye steak.  Ham and bacon. ? High-fat dairy items, such as:  Whole milk.  Butter.  Cream cheese.  Eat small meals often. Avoid eating large meals.  Avoid drinking large amounts of liquid with your meals.  Avoid eating meals during the 2-3 hours before bedtime.  Avoid lying down right after you eat.  Do not exercise right after you eat. Lifestyle      If you are overweight, lose an amount of weight that is healthy for you. Ask your doctor about a safe weight loss goal.  Do not use any products that contain nicotine or tobacco, including cigarettes, e-cigarettes, and chewing tobacco. These can make your symptoms worse. If you need help quitting, ask your doctor.  Wear loose clothes. Do not wear anything tight around your waist.  Raise (elevate) the head of your bed about 6 inches (15 cm) when you sleep.  Try to lower your stress. If you need help doing this, ask your doctor. General instructions  Pay attention to any changes in your symptoms.  Take over-the-counter and prescription medicines only as told by your doctor. ? Do not take aspirin, ibuprofen, or other NSAIDs unless your doctor says it is okay. ? Stop medicines only as told by your doctor.  Keep all follow-up visits as told by your doctor. This is important. Contact a doctor if:  You have new symptoms.  You lose weight and you do not know why it is happening.  You have trouble swallowing, or it hurts to swallow.  You have wheezing or a cough that keeps happening.  Your symptoms do not get better with treatment.  You have heartburn often for more than 2 weeks. Get help right away if:  You have pain in your arms, neck, jaw, teeth, or back.  You feel sweaty, dizzy, or  light-headed.  You have chest pain or shortness of breath.  You throw up (vomit) and your throw up looks like blood or coffee grounds.  Your poop (stool) is bloody or black. These symptoms may represent a serious problem that is an emergency. Do not wait to see if the symptoms will go away. Get medical help right away. Call your local emergency services (911 in the [place] Do not drive yourself to the hospital. Summary  Heartburn is a type of pain that can happen in the throat or chest. It can feel like a burning pain. It may also cause a bad, acid-like taste in the mouth.  You may need to avoid certain foods and drinks to help your symptoms. Ask your doctor what foods and drinks you should avoid.  Take over-the-counter and prescription medicines only as told by your doctor. Do not  take aspirin, ibuprofen, or other NSAIDs unless your doctor told you to do so.  Contact your doctor if your symptoms do not get better or they get worse. This information is not intended to replace advice given to you by your health care provider. Make sure you discuss any questions you have with your health care provider. Document Revised: 11/22/2017 Document Reviewed: 11/22/2017 Elsevier Patient Education  [place].

## 2020-07-02 NOTE — Assessment & Plan Note (Signed)
Perennial rhinoconjunctivitis symptoms for 5 years.  Tried Allegra, Zyrtec and Flonase with some benefit.  No prior allergy/ENT evaluation.  Today's skin testing showed:Positive to ragweed, trees, mold, cat, dog.   Start environmental control measures as below.  May use over the counter antihistamines such as Zyrtec (cetirizine), Claritin (loratadine), Allegra (fexofenadine), or Xyzal (levocetirizine) daily as needed.  May use Flonase (fluticasone) nasal spray 1 spray per nostril twice a day as needed for nasal congestion.   Nasal saline spray (i.e., Simply Saline) or nasal saline lavage (i.e., NeilMed) is recommended as needed and prior to medicated nasal sprays.  May use olopatadine eye drops 0.2% once a day as needed for itchy/watery eyes.  Read about allergy injections.

## 2020-07-19 ENCOUNTER — Encounter: Payer: Self-pay | Admitting: Family Medicine

## 2020-07-19 ENCOUNTER — Telehealth (INDEPENDENT_AMBULATORY_CARE_PROVIDER_SITE_OTHER): Payer: BC Managed Care – PPO | Admitting: Family Medicine

## 2020-07-19 ENCOUNTER — Other Ambulatory Visit: Payer: Self-pay

## 2020-07-19 DIAGNOSIS — B349 Viral infection, unspecified: Secondary | ICD-10-CM | POA: Diagnosis not present

## 2020-07-19 DIAGNOSIS — R110 Nausea: Secondary | ICD-10-CM | POA: Diagnosis not present

## 2020-07-19 DIAGNOSIS — L853 Xerosis cutis: Secondary | ICD-10-CM

## 2020-07-19 DIAGNOSIS — R7303 Prediabetes: Secondary | ICD-10-CM

## 2020-07-19 MED ORDER — ONDANSETRON HCL 8 MG PO TABS: 8.0000 mg | ORAL_TABLET | Freq: Three times a day (TID) | ORAL | 0 refills | Status: DC | PRN

## 2020-07-19 NOTE — Patient Instructions (Addendum)
Although your symptoms are not classic, you are not vaccinated and have had previous COVID 4 months ago, and therefore you may very well have the new omicron strain of COVID.  We are doing COVID testing on you.  You should remain quarantined until you hear the test results.  Since I will be out of the country call the office if you have not heard anything by Tuesday.  I am prescribing for you some triamcinolone lotion that you can use on your legs once or twice a day as needed for itching.  It is a nonspecific dry skin rash, possibly related to being a prediabetic.  I would advise using triamcinolone lotion, and then using some skin moisturizing lotion on top of that.  Since you are not taking your montelukast you may wish to consider going back to a regular antihistamine.  Zyrtec, Claritin, or Allegra (cetirizine, loratadine, or fexofenadine) all can be helpful for the itching.  Make an appointment to return in a couple of weeks regarding the diabetes questions.  When you are over your current symptoms I would still advise you getting the COVID vaccinations even though you had COVID in the early fall.  Take ondansetron every 6 or 8 hours if needed for nausea.  You can take over-the-counter Imodium for the diarrhea.  Return if stomach problems are worse or go to an emergency room if absolutely necessary. If you have lab work done today you will be contacted with your lab results within the next 2 weeks.  If you have not heard from [place] then please contact us. The fastest way to get your results is to register for My Chart.   IF you received an x-ray today, you will receive an invoice from [facility]. Please contact [facility] at [phone] with questions or concerns regarding your invoice.   IF you received labwork today, you will receive an invoice from [facility]. Please contact [facility] at [phone] with questions or concerns regarding your invoice.   Our billing staff  will not be able to assist you with questions regarding bills from these companies.  You will be contacted with the lab results as soon as they are available. The fastest way to get your results is to activate your My Chart account. Instructions are located on the last page of this paperwork. If you have not heard from [place] regarding the results in 2 weeks, please contact this office.

## 2020-07-19 NOTE — Progress Notes (Signed)
Patient ID: [name], female    DOB: 11-Nov-1970  Age: 50 y.o. MRN: [id number]  Chief Complaint  Patient presents with  . Rash    Pt having bilateral leg itching with rash starting 2-3 months ago.   . Diarrhea    Pt came back from [place] Tuesday and is now having diarrhea and headache, pt notes nausea as well     Subjective:   Patient has been having nausea and diarrhea since Tuesday when she came back from the [place]  She also has been having a bilateral leg rash for 2 or 3 weeks.  Mostly just dry skin and itching.  Current allergies, medications, problem list, past/family and social histories reviewed.  Objective:  There were no vitals taken for this visit.  Examined her in her car because she has risk of COVID.  I went to look at the legs and I talked to her about the diarrhea and and nausea.  Did not do an extensive exam of her abdomen obviously.  Looked at the legs.  There is no major rash, just dry skin appearance.  Assessment & Plan:   Assessment: 1. Dry skin dermatitis   2. Pre-diabetes   3. Viral syndrome   4. Nausea without vomiting       Plan: See instructions.  She wanted to be diabetic checked and I told her she needs to come back for that.  No orders of the defined types were placed in this encounter.   No orders of the defined types were placed in this encounter.        Patient Instructions   Although your symptoms are not classic, you are not vaccinated and have had previous COVID 4 months ago, and therefore you may very well have the new omicron strain of COVID.  We are doing COVID testing on you.  You should remain quarantined until you hear the test results.  Since I will be out of the country call the office if you have not heard anything by Tuesday.  I am prescribing for you some triamcinolone lotion that you can use on your legs once or twice a day as needed for itching.  It is a nonspecific dry skin rash,  possibly related to being a prediabetic.  I would advise using triamcinolone lotion, and then using some skin moisturizing lotion on top of that.  Since you are not taking your montelukast you may wish to consider going back to a regular antihistamine.  Zyrtec, Claritin, or Allegra (cetirizine, loratadine, or fexofenadine) all can be helpful for the itching.  Make an appointment to return in a couple of weeks regarding the diabetes questions.  When you are over your current symptoms I would still advise you getting the COVID vaccinations even though you had COVID in the early fall.  Take ondansetron every 6 or 8 hours if needed for nausea.  You can take over-the-counter Imodium for the diarrhea.  Return if stomach problems are worse or go to an emergency room if absolutely necessary. If you have lab work done today you will be contacted with your lab results within the next 2 weeks.  If you have not heard from [place] then please contact us. The fastest way to get your results is to register for My Chart.   IF you received an x-ray today, you will receive an invoice from [facility]. Please contact [facility] at [phone] with questions or concerns regarding your invoice.   IF you  received labwork today, you will receive an invoice from [facility]. Please contact [facility] at [phone] with questions or concerns regarding your invoice.   Our billing staff will not be able to assist you with questions regarding bills from these companies.  You will be contacted with the lab results as soon as they are available. The fastest way to get your results is to activate your My Chart account. Instructions are located on the last page of this paperwork. If you have not heard from [place] regarding the results in 2 weeks, please contact this office.        Return Return in a few weeks to follow-up on the diabetes question.   Ruben Reason, MD 07/19/2020

## 2020-07-22 LAB — COVID-19, FLU A+B AND RSV
Influenza A, NAA: NOT DETECTED
Influenza B, NAA: NOT DETECTED
RSV, NAA: NOT DETECTED
SARS-CoV-2, NAA: NOT DETECTED

## 2020-07-23 ENCOUNTER — Ambulatory Visit: Payer: Self-pay | Admitting: *Deleted

## 2020-07-23 NOTE — Telephone Encounter (Signed)
Pt notified of negative COVID-19 results. Understanding verbalized.   

## 2020-10-01 ENCOUNTER — Ambulatory Visit: Payer: BC Managed Care – PPO | Admitting: Allergy

## 2020-10-01 DIAGNOSIS — J309 Allergic rhinitis, unspecified: Secondary | ICD-10-CM

## 2020-10-01 NOTE — Progress Notes (Deleted)
Follow Up Note  RE: DESHANTI Pruitt MRN: [id number] DOB: 1970-07-15 Date of Office Visit: 10/01/2020  Referring provider: Jacelyn Pi, Lilia Argue, * Primary care provider: Jacelyn Pi, Lilia Argue, MD  Chief Complaint: No chief complaint on file.  History of Present Illness: I had the pleasure of seeing [name] for a follow up visit at the [facility] [place] on 10/01/2020. She is a 50 y.o. female, who is being followed for asthma, allergic rhinoconjunctivitis, adverse food reaction, pruritus and heartburn. Her previous allergy office visit was on 07/02/2020 with Dr. Maudie Mercury. Today is a regular follow up visit.  Asthma, not well controlled Episodes of chest tightness, shortness of breath, coughing, wheezing for 5 years. Using albuterol 5 times per week with good benefit. Triggers include allergies.   Today's spirometry showed: restrictive disease with 5% improvement in FEV1 post bronchodilator treatment. Clinically feeling slightly improved.   Daily controller medication(s):start Symbicort 17mcg 2 puffs twice a day with spacer and rinse mouth afterwards.  Spacer given and demonstrated proper use with inhaler. Patient understood technique and all questions/concerned were addressed.   May use albuterol rescue inhaler 2 puffs every 4 to 6 hours as needed for shortness of breath, chest tightness, coughing, and wheezing. May use albuterol rescue inhaler 2 puffs 5 to 15 minutes prior to strenuous physical activities. Monitor frequency of use.   Repeat spirometry at next visit.  Other allergic rhinitis Perennial rhinoconjunctivitis symptoms for 5 years.  Tried Allegra, Zyrtec and Flonase with some benefit.  No prior allergy/ENT evaluation.  Today's skin testing showed:Positive to ragweed, trees, mold, cat, dog.   Start environmental control measures as below.  May use over the counter antihistamines such as Zyrtec (cetirizine), Claritin (loratadine), Allegra  (fexofenadine), or Xyzal (levocetirizine) daily as needed.  May use Flonase (fluticasone) nasal spray 1 spray per nostril twice a day as needed for nasal congestion.   Nasal saline spray (i.e., Simply Saline) or nasal saline lavage (i.e., NeilMed) is recommended as needed and prior to medicated nasal sprays.  May use olopatadine eye drops 0.2% once a day as needed for itchy/watery eyes.  Read about allergy injections.  Allergic conjunctivitis of both eyes  See assessment and plan as above for allergic rhinitis.  Adverse reaction to food, subsequent encounter Fish caused vomiting and abdominal pains. Tolerates shellfish. Does not eat mollusks.  Today's skin testing showed: Negative to finned fish and mollusks.   Continue to avoid finned fish.  Food allergen skin testing has excellent negative predictive value however there is still a small chance that the allergy exists. Therefore, we will investigate further with serum specific IgE levels and, if negative then schedule for open graded oral food challenge.  A laboratory order form has been provided for serum specific IgE against finned fish.  Until the food allergy has been definitively ruled out, the patient is to continue meticulous avoidance of finned fish and have access to epinephrine autoinjector 2 pack.  For mild symptoms you can take over the counter antihistamines such as Benadryl and monitor symptoms closely. If symptoms worsen or if you have severe symptoms including breathing issues, throat closure, significant swelling, whole body hives, severe diarrhea and vomiting, lightheadedness then inject epinephrine and seek immediate medical care afterwards.  Emergency action plan given.  Pruritus Itching without rash.  Most common cause of pruritus during this time of the year is xerosis.  Stressed importance of moisturizing skin and proper skin care.  If no improvement, may need  further work up.   Heartburn Heartburn  and used to be on PPI.  Gave handout of lifestyle modification.  Return in about 3 months (around 09/30/2020).   Assessment and Plan: [name] is a 50 y.o. female with: No problem-specific Assessment & Plan notes found for this encounter.  No follow-ups on file.  No orders of the defined types were placed in this encounter.  Lab Orders  No laboratory test(s) ordered today    Diagnostics: Spirometry:  Tracings reviewed. Her effort: {Blank single:19197::"Good reproducible efforts.","It was hard to get consistent efforts and there is a question as to whether this reflects a maximal maneuver.","Poor effort, data can not be interpreted."} FVC: ***L FEV1: ***L, ***% predicted FEV1/FVC ratio: ***% Interpretation: {Blank single:19197::"Spirometry consistent with mild obstructive disease","Spirometry consistent with moderate obstructive disease","Spirometry consistent with severe obstructive disease","Spirometry consistent with possible restrictive disease","Spirometry consistent with mixed obstructive and restrictive disease","Spirometry uninterpretable due to technique","Spirometry consistent with normal pattern","No overt abnormalities noted given today's efforts"}.  Please see scanned spirometry results for details.  Skin Testing: {Blank single:19197::"Select foods","Environmental allergy panel","Environmental allergy panel and select foods","Food allergy panel","None","Deferred due to recent antihistamines use"}. Positive test to: ***. Negative test to: ***.  Results discussed with patient/family.   Medication List:  Current Outpatient Medications  Medication Sig Dispense Refill  . Acetaminophen (TYLENOL PO) Take by mouth.    Marland Kitchen albuterol (VENTOLIN HFA) 108 (90 Base) MCG/ACT inhaler INHALE 2 PUFFS INTO THE LUNGS EVERY 6 HOURS AS NEEDED FOR WHEEZING OR SHORTNESS OF BREATH 18 g 5  . budesonide-formoterol (SYMBICORT) 80-4.5 MCG/ACT inhaler Inhale 2 puffs into the lungs in the morning  and at bedtime. with spacer and rinse mouth afterwards. 1 each 5  . EPINEPHrine (AUVI-Q) 0.3 mg/0.3 mL IJ SOAJ injection Inject 0.3 mg into the muscle as needed for anaphylaxis. 1 each 1  . lisinopril (ZESTRIL) 20 MG tablet TAKE 1 TABLET(20 MG) BY MOUTH DAILY 90 tablet 1  . Olopatadine HCl 0.2 % SOLN Apply 1 drop to eye daily as needed (itchy/watery eyes). 2.5 mL 5  . omeprazole (PRILOSEC) 20 MG capsule Take 1 capsule (20 mg total) by mouth 2 (two) times daily before a meal. 60 capsule 3  . ondansetron (ZOFRAN) 8 MG tablet Take 1 tablet (8 mg total) by mouth every 8 (eight) hours as needed for nausea or vomiting. 15 tablet 0   No current facility-administered medications for this visit.   Allergies: Allergies  Allergen Reactions  . Fish Allergy Nausea And Vomiting   I reviewed her past medical history, social history, family history, and environmental history and no significant changes have been reported from her previous visit.  Review of Systems  Constitutional: Negative for appetite change, chills, fever and unexpected weight change.  HENT: Positive for congestion. Negative for rhinorrhea.   Eyes: Negative for itching.  Respiratory: Positive for cough, chest tightness and shortness of breath. Negative for wheezing.   Cardiovascular: Negative for chest pain.  Gastrointestinal: Negative for abdominal pain.  Genitourinary: Negative for difficulty urinating.  Skin: Negative for rash.       pruritus  Allergic/Immunologic: Positive for environmental allergies.  Neurological: Negative for headaches.   Objective: There were no vitals taken for this visit. There is no height or weight on file to calculate BMI. Physical Exam Vitals and nursing note reviewed.  Constitutional:      Appearance: Normal appearance. She is well-developed.  HENT:     Head: Normocephalic and atraumatic.     Right Ear: Tympanic membrane and  external ear normal.     Left Ear: Tympanic membrane and external ear  normal.     Nose: Nose normal.     Mouth/Throat:     Mouth: Mucous membranes are moist.     Pharynx: Oropharynx is clear.  Eyes:     Conjunctiva/sclera: Conjunctivae normal.  Cardiovascular:     Rate and Rhythm: Normal rate and regular rhythm.     Heart sounds: Normal heart sounds. No murmur heard. No friction rub. No gallop.   Pulmonary:     Effort: Pulmonary effort is normal.     Breath sounds: Normal breath sounds. No wheezing, rhonchi or rales.  Musculoskeletal:     Cervical back: Neck supple.  Skin:    General: Skin is warm.     Findings: No rash.  Neurological:     Mental Status: She is alert and oriented to person, place, and time.  Psychiatric:        Behavior: Behavior normal.    Previous notes and tests were reviewed. The plan was reviewed with the patient/family, and all questions/concerned were addressed.  It was my pleasure to see Heela today and participate in her care. Please feel free to contact me with any questions or concerns.  Sincerely,  Rexene Alberts, DO Allergy & Immunology  [facility] office: [place] office: [phone]

## 2020-11-15 ENCOUNTER — Ambulatory Visit
Admission: RE | Admit: 2020-11-15 | Discharge: 2020-11-15 | Disposition: A | Discharge status: Home / Self Care | Payer: BC Managed Care – PPO | Source: Ambulatory Visit

## 2020-11-15 ENCOUNTER — Other Ambulatory Visit: Payer: Self-pay

## 2020-11-15 ENCOUNTER — Other Ambulatory Visit: Payer: Self-pay | Admitting: Family Medicine

## 2020-11-15 DIAGNOSIS — M533 Sacrococcygeal disorders, not elsewhere classified: Secondary | ICD-10-CM

## 2020-11-29 ENCOUNTER — Ambulatory Visit (INDEPENDENT_AMBULATORY_CARE_PROVIDER_SITE_OTHER): Payer: BC Managed Care – PPO | Admitting: Obstetrics & Gynecology

## 2020-11-29 ENCOUNTER — Other Ambulatory Visit: Payer: Self-pay

## 2020-11-29 ENCOUNTER — Other Ambulatory Visit: Payer: Self-pay | Admitting: Obstetrics & Gynecology

## 2020-11-29 ENCOUNTER — Encounter: Payer: Self-pay | Admitting: Obstetrics & Gynecology

## 2020-11-29 ENCOUNTER — Other Ambulatory Visit (HOSPITAL_COMMUNITY)
Admission: RE | Admit: 2020-11-29 | Discharge: 2020-11-29 | Disposition: A | Discharge status: Home / Self Care | Payer: BC Managed Care – PPO | Source: Ambulatory Visit | Attending: Obstetrics & Gynecology | Admitting: Obstetrics & Gynecology

## 2020-11-29 VITALS — BP 124/80 | Ht 62.0 in | Wt 159.0 lb

## 2020-11-29 DIAGNOSIS — Z780 Asymptomatic menopausal state: Secondary | ICD-10-CM

## 2020-11-29 DIAGNOSIS — R6882 Decreased libido: Secondary | ICD-10-CM

## 2020-11-29 DIAGNOSIS — N393 Stress incontinence (female) (male): Secondary | ICD-10-CM

## 2020-11-29 DIAGNOSIS — Z01419 Encounter for gynecological examination (general) (routine) without abnormal findings: Secondary | ICD-10-CM

## 2020-11-29 DIAGNOSIS — Z9851 Tubal ligation status: Secondary | ICD-10-CM

## 2020-11-29 DIAGNOSIS — Z1231 Encounter for screening mammogram for malignant neoplasm of breast: Secondary | ICD-10-CM

## 2020-11-29 NOTE — Progress Notes (Signed)
[name] 05-02-1971 [id number]   History:    50 y.o. [id number] Married. S/P Tubal Ligation.  MK:[id number] presenting for annual gyn exam   KPV:[id number], well on no HRT.  No PMB. No pelvic pain. Low libido.  Rarely sexually active, dry with IC. Breasts normal.  Needs to schedule screening mammo. Mild SUI when coughing or sneezing.  Bowel movements normal. Body mass index 29.08. Physically active at work.  Health labs with family physician.  Colono 2021.   Past medical history,surgical history, family history and social history were all reviewed and documented in the EPIC chart.  Gynecologic History No LMP recorded (lmp unknown). Patient is postmenopausal.  Obstetric History OB History  Gravida Para Term Preterm AB Living  4 3 3  0 1 3  SAB IAB Ectopic Multiple Live Births  0 1 0 0      # Outcome Date GA Lbr Len/2nd Weight Sex Delivery Anes PTL Lv  4 IAB           3 Term           2 Term           1 Term              ROS: A ROS was performed and pertinent positives and negatives are included in the history.  GENERAL: No fevers or chills. HEENT: No change in vision, no earache, sore throat or sinus congestion. NECK: No pain or stiffness. CARDIOVASCULAR: No chest pain or pressure. No palpitations. PULMONARY: No shortness of breath, cough or wheeze. GASTROINTESTINAL: No abdominal pain, nausea, vomiting or diarrhea, melena or bright red blood per rectum. GENITOURINARY: No urinary frequency, urgency, hesitancy or dysuria. MUSCULOSKELETAL: No joint or muscle pain, no back pain, no recent trauma. DERMATOLOGIC: No rash, no itching, no lesions. ENDOCRINE: No polyuria, polydipsia, no heat or cold intolerance. No recent change in weight. HEMATOLOGICAL: No anemia or easy bruising or bleeding. NEUROLOGIC: No headache, seizures, numbness, tingling or weakness. PSYCHIATRIC: No depression, no loss of interest in normal activity or change in sleep pattern.      Exam:   BP 124/80 (BP Location: Right Arm, Patient Position: Sitting, Cuff Size: Normal)   Ht 5\' 2"  (1.575 m)   Wt 159 lb (72.1 kg)   LMP  (LMP Unknown)   BMI 29.08 kg/m   Body mass index is 29.08 kg/m.  General appearance : Well developed well nourished female. No acute distress HEENT: Eyes: no retinal hemorrhage or exudates,  Neck supple, trachea midline, no carotid bruits, no thyroidmegaly Lungs: Clear to auscultation, no rhonchi or wheezes, or rib retractions  Heart: Regular rate and rhythm, no murmurs or gallops Breast:Examined in sitting and supine position were symmetrical in appearance, no palpable masses or tenderness,  no skin retraction, no nipple inversion, no nipple discharge, no skin discoloration, no axillary or supraclavicular lymphadenopathy Abdomen: no palpable masses or tenderness, no rebound or guarding Extremities: no edema or skin discoloration or tenderness  Pelvic: Vulva: Normal             Vagina: No gross lesions or discharge  Cervix: No gross lesions or discharge.  Pap reflex done.  Uterus AV, normal size, shape and consistency, non-tender and mobile  Adnexa  Without masses or tenderness  Anus: Normal   Assessment/Plan:  50 y.o. female for annual exam   1. Encounter for routine gynecological examination with Papanicolaou smear of cervix Normal gynecologic exam in menopause.  Pap reflex done.  Breast exam normal.  Schedule screening mammogram now.  Colonoscopy 2021.  Body mass index 29.08.  Recommend a mildly lower calorie/carb diet and improved fitness.  Health labs with family physician.  2. S/P tubal ligation  3. Postmenopause Well on no hormone replacement therapy.  No postmenopausal bleeding.  Vitamin D supplements, calcium intake of 1.5 g/day total and regular weightbearing physical activities.  4. Low libido Low libido and dryness with intercourse.  Recommend coconut oil.  Counseling on low libido done.  5. SUI (stress urinary  incontinence, female) Mild stress urinary incontinence.  No cystocele.  Recommend avoiding overfilling of the bladder.  Recommend Kegel exercises.  Instructions for Kegels done.  If no improvements at 6 months, will refer to physical therapy for pelvic floor reinforcement.  Princess Bruins MD, 10:44 AM 11/29/2020

## 2020-12-03 LAB — CYTOLOGY - PAP: Diagnosis: NEGATIVE

## 2020-12-06 ENCOUNTER — Other Ambulatory Visit: Payer: Self-pay

## 2020-12-06 ENCOUNTER — Ambulatory Visit
Admission: RE | Admit: 2020-12-06 | Discharge: 2020-12-06 | Disposition: A | Discharge status: Home / Self Care | Payer: BC Managed Care – PPO | Source: Ambulatory Visit | Attending: Obstetrics & Gynecology | Admitting: Obstetrics & Gynecology

## 2020-12-06 DIAGNOSIS — Z1231 Encounter for screening mammogram for malignant neoplasm of breast: Secondary | ICD-10-CM

## 2021-01-10 ENCOUNTER — Other Ambulatory Visit: Payer: Self-pay | Admitting: Allergy

## 2021-09-04 ENCOUNTER — Ambulatory Visit
Admit: 2021-09-04 | Discharge: 2021-09-04 | Payer: BLUE CROSS/BLUE SHIELD | Attending: Nurse Practitioner | Primary: Internal Medicine

## 2021-09-04 MED ORDER — TRIAMCINOLONE ACETONIDE 0.5 % EX CREA
0.5 % | CUTANEOUS | 1 refills | Status: AC
Start: 2021-09-04 — End: ?

## 2021-09-04 MED ORDER — LISINOPRIL 20 MG PO TABS
20 MG | ORAL_TABLET | Freq: Every day | ORAL | 0 refills | Status: AC
Start: 2021-09-04 — End: 2021-10-04

## 2021-09-04 NOTE — Progress Notes (Signed)
Subjective:      Patient ID: Brittany Holloway is a 51 y.o. female, here for evaluation of the following chief complaint(s): Medication Refill (Rx for Lisinopril until she can see new provider.)     - med refill: has been on lisinopril about 7 yrs, ran out 6 days ago. Has had intermittent headache, uncertain if related to BP or allergies. Recently moved here from [facility], [place]. Has appt to estb care with Dr. Dyanne Carrel on 3/29. Denies chest pain, pressure, palpitations, sob, syncope.     - Itchy rash on lower leg, has had steroid cream in the past that helped. Hx of allergies. Had testing done in [place], allergic to trees, grass, cats, dogs, etc. Has not had allergy immunotherapy. Takes allegra daily.    Review of Systems - as noted in HPI    Past Medical History:   Diagnosis Date    Hypertension      Current Outpatient Medications   Medication Instructions    lisinopril (PRINIVIL;ZESTRIL) 20 mg, Oral, DAILY     Objective:   BP (!) 146/90 (Site: Right Upper Arm, Position: Sitting, Cuff Size: Medium Adult)    Pulse 64    Resp 18    Wt 155 lb 4.8 oz (70.4 kg)    SpO2 99%     Physical Exam  Vitals and nursing note reviewed.   Constitutional:       General: She is not in acute distress.     Appearance: Normal appearance.   Eyes:      Extraocular Movements: Extraocular movements intact.      Conjunctiva/sclera: Conjunctivae normal.      Pupils: Pupils are equal, round, and reactive to light.   Cardiovascular:      Rate and Rhythm: Normal rate and regular rhythm.      Heart sounds: Normal heart sounds.   Pulmonary:      Effort: Pulmonary effort is normal.      Breath sounds: Normal breath sounds.   Musculoskeletal:         General: Normal range of motion.      Cervical back: Normal range of motion.   Skin:     Findings: Rash (right lower extremity, fine papular rash, no edema or calor) present.   Neurological:      General: No focal deficit present.      Mental Status: She is alert and oriented to person, place, and time.       Cranial Nerves: No cranial nerve deficit.   Psychiatric:         Mood and Affect: Mood normal.         Behavior: Behavior normal.         Thought Content: Thought content normal.         Judgment: Judgment normal.     No results found for any visits on 09/04/21.  Assessment/Plan:       ICD-10-CM    1. Essential hypertension  I10 lisinopril (PRINIVIL;ZESTRIL) 20 MG tablet      2. Rash  R21 triamcinolone (ARISTOCORT) 0.5 % cream         Essential hypertension [I10]   Rx provided. Encouraged her to stay well hydrated, take med daily, f/u with new PCP appt as scheduled. She stated understanding and agreement with plan      Signed: Elnita Maxwell A. Royal Hawthorn, FNP-C

## 2021-10-01 ENCOUNTER — Encounter

## 2021-12-31 IMAGING — MG MM DIGITAL SCREENING BILAT W/ TOMO AND CAD
8 series · 8 of 24 positions shown · non-contrast
Comparison: Previous exam(s).

CLINICAL DATA: Screening.

EXAM:
DIGITAL SCREENING BILATERAL MAMMOGRAM WITH TOMOSYNTHESIS AND CAD
TECHNIQUE: Bilateral screening digital craniocaudal and mediolateral oblique
mammograms were obtained. Bilateral screening digital breast
tomosynthesis was performed. The images were evaluated with
computer-aided detection.

[R CC synth-2D]
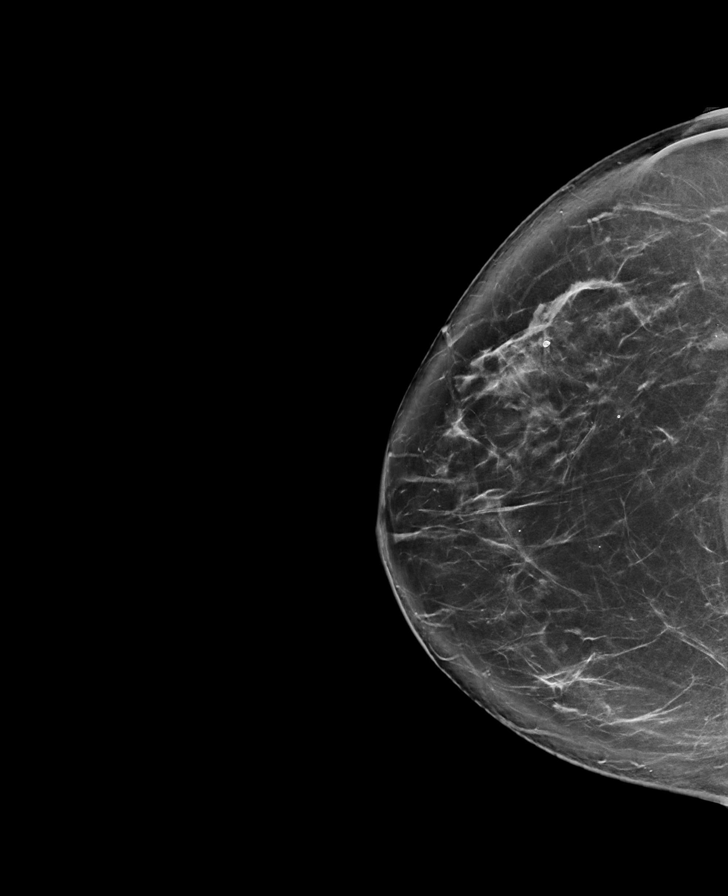

[L CC synth-2D]
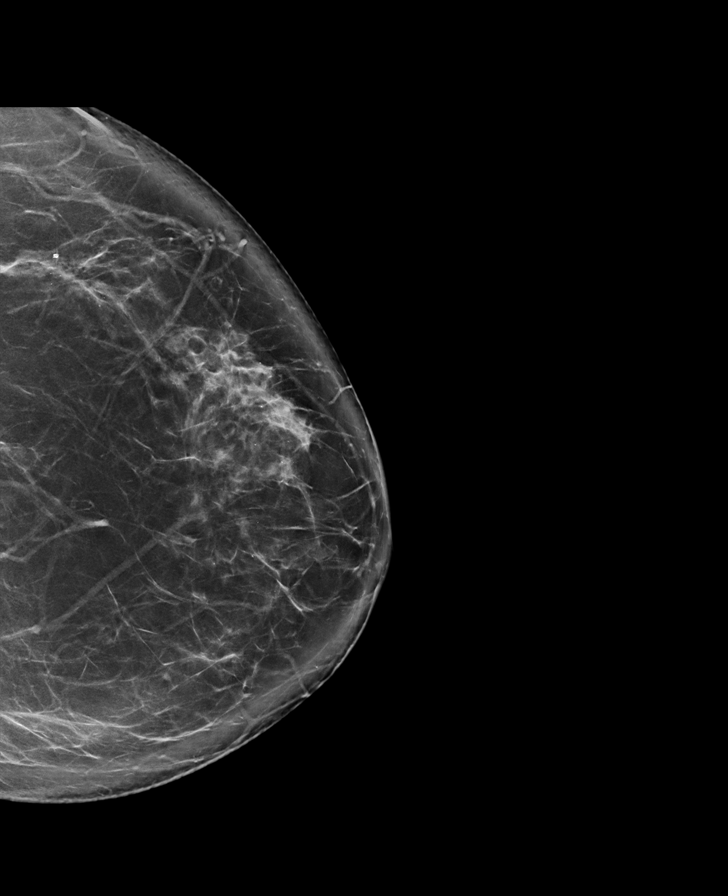

[R MLO synth-2D]
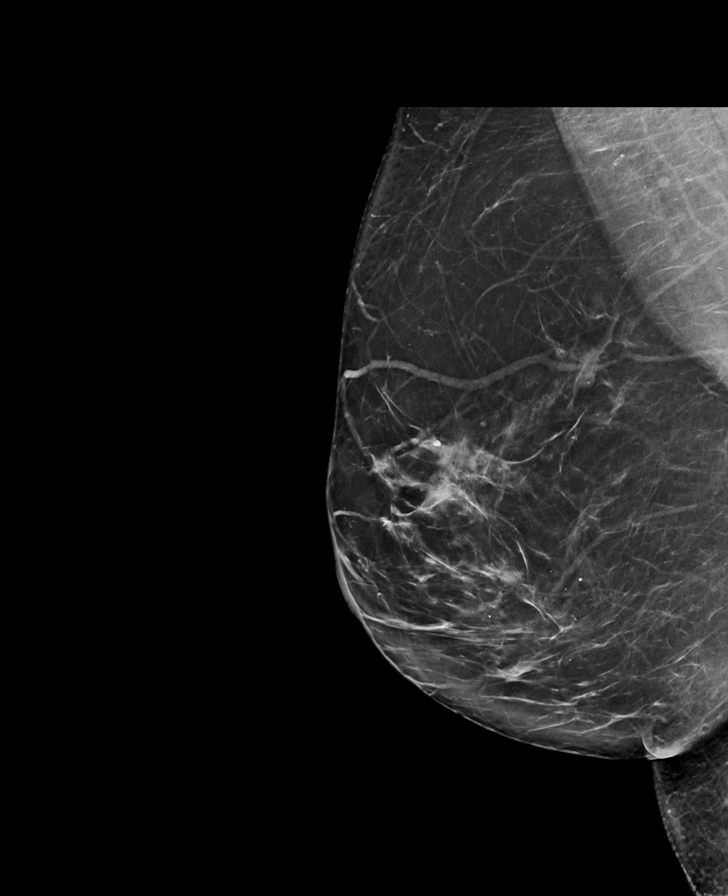

[L MLO synth-2D]
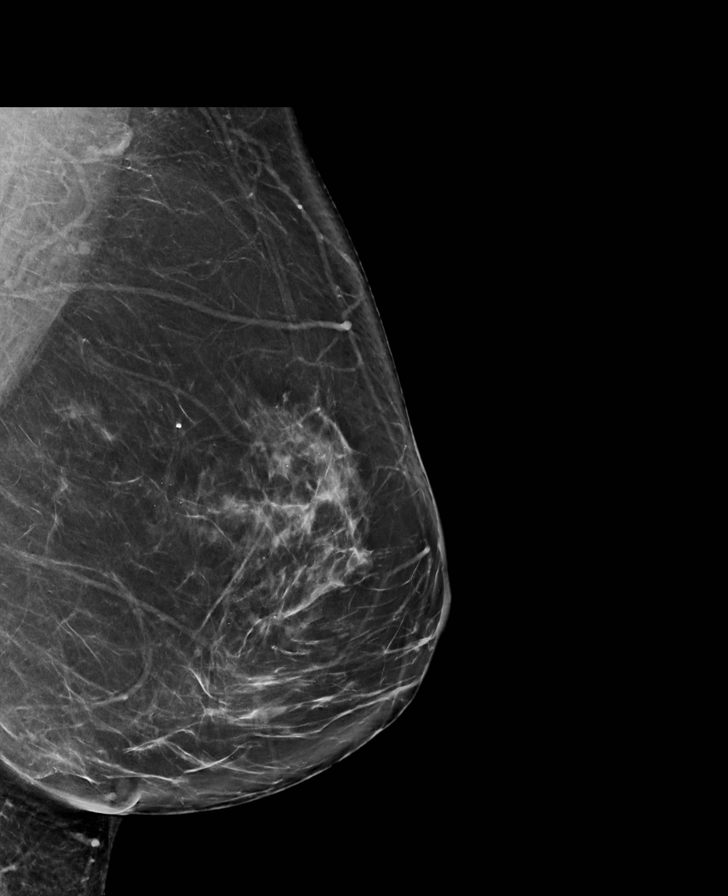

[L MLO tomo · tomo slice 47/94.0]
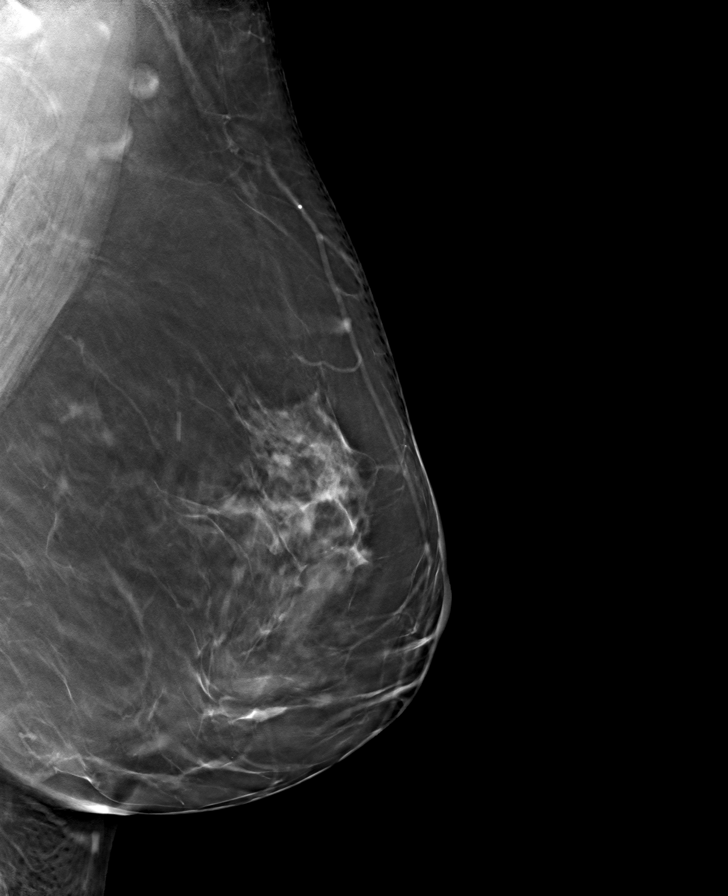

[R CC tomo · tomo slice 47/92.0]
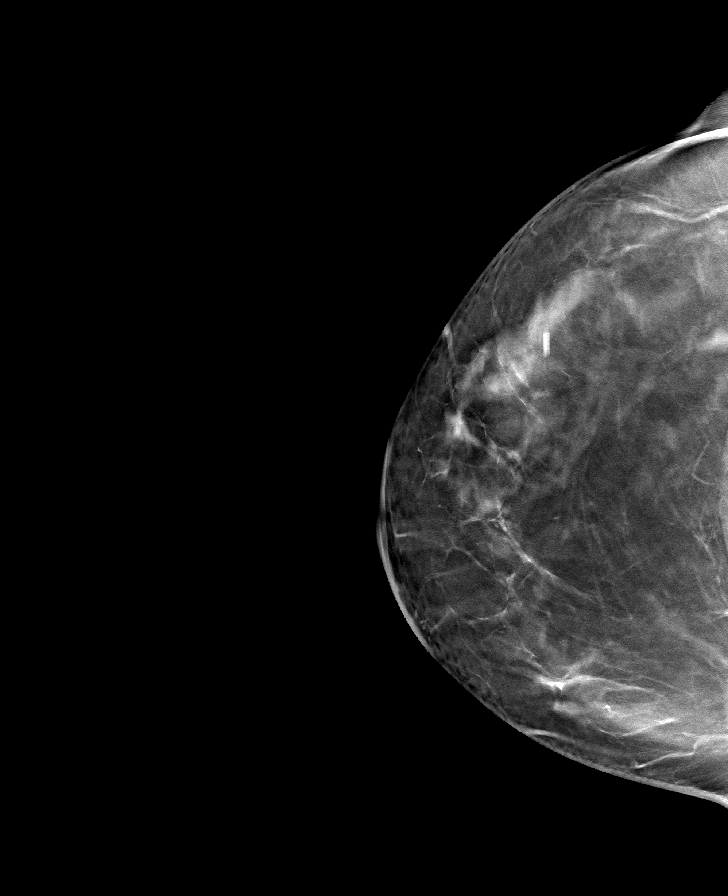

[R MLO tomo · tomo slice 47/92.0]
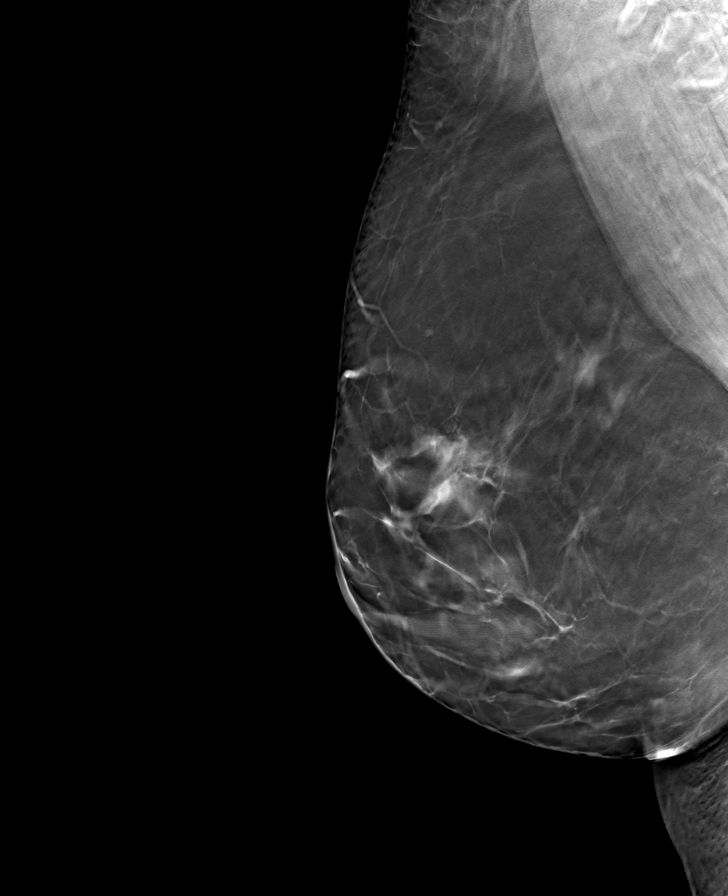

[L CC tomo · tomo slice 49/96.0]
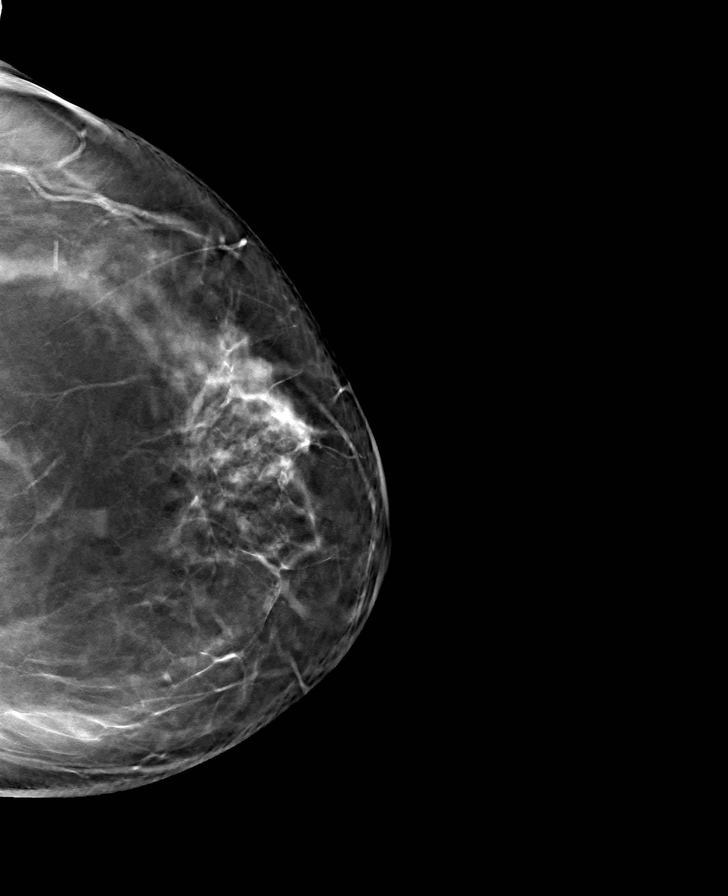

[8 of 24 positions shown; findings below may reference images not displayed]

ACR Breast Density Category c: The breast tissue is heterogeneously
dense, which may obscure small masses.
FINDINGS: There are no findings suspicious for malignancy. The images were
evaluated with computer-aided detection.
IMPRESSION: No mammographic evidence of malignancy. A result letter of this
screening mammogram will be mailed directly to the patient.

RECOMMENDATION:
Screening mammogram in one year. (Code:T4-5-GWO)

BI-RADS CATEGORY  1: Negative.

## 2022-07-24 NOTE — Patient Instructions (Signed)
Formatting of this note might be different from the original.  Images from the original note were not included.  For your labs or X-rays, please go to the [facility] building at [address], [place], which is located across the street behind the [employer].      We recommend turning right from our clinic parking lot, This will allow you to go to a stoplight in order to [place]. Labs are on the 3rd floor.     No appointment is needed for labs or X-rays    Hours: Monday-Friday 7:30 am to 4:45 pm      Electronically signed by Bennie Dallas, MD at 07/24/2022  9:53 AM EST

## 2022-07-24 NOTE — Progress Notes (Signed)
Formatting of this note is different from the original.    [facility]IC[facility]ED[facility]AT[facility]     Encounter Date: 07/24/2022  Patient Name (MRN): Brittany Holloway ([id number])  Primary Care Provider: Clyda Greener, MD    Subjective     Chief Complaint   Patient presents with    Establish Care     History of Present Illness:   Brittany Holloway is a 52 y.o. female with pmh HTN, asthma presenting to establish care.     Just moved from [facility], works at [employer] with husband, 3 kids, and 1 granddaughter    HCM:  - Last mammogram 2021, normal  - Last colonoscopy 2021, benign polyp  - Last pap 2022, normal  - Due for shingles   - Declined flu    HTN:   - Taking lisinopril for the last 8 years without issue    Eczema:  - Dry, itchy rash on bilateral lower extremities  - Has been to dermatology, on triamcinolone which helps with itchiness    Asthma  - Has albuterol PRN, takes appx 1x/week  - Often triggered by dust, cats    GERD:  - Has had positive H. Pylori in the past, had treatment but never test of cure  - Currently on omeprazole    Lower back pain: fairly consistently for the last several years  - Had imaging in the [place] 5 years ago and they recommended surgery, but she did not have it done because she does not want to have surgery  - Occasionally takes ibuprofen or tylenol, appx 1x/week    Hx pelvic mass:   - Removed in 2019, pt thinks L ovary also removed    Review of Systems: Pertinent positive and negative review of systems outlined in HPI.      PAST MEDICAL HISTORY:  has a past medical history of Asthma, Hyperlipidemia, Hypertension, and Prediabetes.    PAST SURGICAL HISTORY:  has a past surgical history that includes Oophorectomy (Left).  - Ovary removed (she thinks L) 2/2 pelvic mass 2019  - Cesarean section 2007    PAST SOCIAL HISTORY:  reports that she has never smoked. She has never used smokeless tobacco. She reports that she does not drink alcohol and does not use drugs.  Social  History     Social History Narrative    Recently moved from [facility]. Works at [employer]. Lives with husband, 3 children, and 1 grandchild     FAMILY HISTORY: family history includes Diabetes in her mother; Stomach cancer in her father.    CURRENT OUTPATIENT MEDICATIONS  Current Outpatient Medications   Medication Sig    acetaminophen (Tylenol) 325 mg rectal suppository Insert 1 suppository rectally.    budesonide-formoterol (Symbicort) 80-4.5 mcg/actuation inhaler Inhale 2 puffs into the lungs 2 times daily.    lisinopriL (Prinivil) 20 mg tablet Take 1 tablet by mouth daily.    omeprazole (Prilosec) 20 mg delayed release capsule Take 1 capsule by mouth each morning before breakfast.    albuterol 90 mcg/actuation inhaler Inhale 1-2 puffs into the lungs every 6 hours as needed for wheezing or shortness of breath.    cetirizine (Zyrtec) 10 mg tablet Take 1 tablet by mouth daily.    triamcinolone acetonide (Kenalog) 0.1 % ointment Apply topically 2 times daily.    zoster recomb., adjuvanted (PF) (Shingrix) IM injection Inject 0.5 mL into the muscle as directed for 1 day. Repeat in 2 to 6 months.  ALLERGIES:   Review of patient's allergies indicates:   Allergen Reactions    Cat dander Itching    Dog dander Itching    House dust Other (See Comments)     Sneezing    Pollen extracts Other (See Comments)     Sneezing, Watery Eyes     Objective   Vitals: Blood pressure 122/59, pulse 66, temperature 36.3 C (97.4 F), temperature source Skin, resp. rate 19, height 157.5 cm (5\' 2" ), weight 68.1 kg (150 lb 3.2 oz), SpO2 99%. Body mass index is 27.47 kg/m.    Physical Exam:  Physical Exam  Vitals reviewed.   Constitutional:       General: She is not in acute distress.     Appearance: Normal appearance.   HENT:      Head: Normocephalic and atraumatic.      Nose: Nose normal.      Mouth/Throat:      Mouth: Mucous membranes are moist.   Eyes:      Extraocular Movements: Extraocular movements intact.      Conjunctiva/sclera: Conjunctivae  normal.   Cardiovascular:      Rate and Rhythm: Normal rate and regular rhythm.      Pulses: Normal pulses.      Heart sounds: No murmur heard.     No friction rub. No gallop.   Pulmonary:      Effort: Pulmonary effort is normal.      Breath sounds: Normal breath sounds. No wheezing, rhonchi or rales.   Abdominal:      General: Bowel sounds are normal. There is no distension.      Palpations: Abdomen is soft.      Tenderness: There is no abdominal tenderness.   Skin:     General: Skin is warm and dry.      Comments: Dry scaly rash on RLE   Neurological:      General: No focal deficit present.      Mental Status: She is alert and oriented to person, place, and time.     Recent Labs: External labs reviewed. Elevated cholesterol, A1c 6.2    Recent Imaging: No new imaging     Assessment & Plan   Brittany Holloway is a 52 y.o. female with pmh HTN, asthma presenting to establish care.    Brittany Holloway was seen today for establish care.    Diagnoses and all orders for this visit:    Well woman exam (no gynecological exam)  Known HTN  Known HLD  Previous prediabetes  PHQ2 0  No nicotine  No alcohol   Decline STI screening   History of colon polyps  -refer for colonoscopy 12/2024  Breast cancer screening by mammogram  Due for screening mammogram  -     Mam Screening With Tomo; Future    Vaccine for VZV (varicella-zoster virus)  Pt will get shingles vaccine at pharmacy  -     zoster recomb., adjuvanted (PF) (Shingrix) IM injection; Inject 0.5 mL into the muscle as directed for 1 day. Repeat in 2 to 6 months.    Problems:   Uncomplicated asthma, unspecified asthma severity, unspecified whether persistent  Taking albuterol PRN, stable, needs refills today.  -     albuterol 90 mcg/actuation inhaler; Inhale 1-2 puffs into the lungs every 6 hours as needed for wheezing or shortness of breath.    Primary hypertension  Well controlled on lisinopril. Continue lisinopril. Labs today.   -     COMPREHENSIVE METABOLIC PANEL;  Future    Hyperlipidemia, unspecified hyperlipidemia type  Hx of HLD. Recheck labs today.  -     LIPID PANEL; Future    Prediabetes  Hx of prediabetes, most recent A1c 6.2. Recheck today.  -     HEMOGLOBIN A1C; Future    Dermatitis  Dry scaly rash consistent with eczema. Has had improvement with triamcinolone in the past, so will continue today.   -     triamcinolone acetonide (Kenalog) 0.1 % ointment; Apply topically 2 times daily.    H. pylori infection  Hx h pylori infection, pt reports having treatment but never tested to see if cured. Will test today. Pt counseled to hold omeprazole for 2 weeks before collecting stool sample.   -     H. pylori Antigen, Stool; Future    FOLLOW UP   Return in about 6 months (around 01/22/2023) for f/u HTN .    Patient had no further questions regarding treatment plan, goals, risks or benefits and agrees to contact me or my clinic in the interim should any questions or problems arise.     Lafayette Dragon, MS4  Pager: [phone]  07/24/2022  10:17 AM    I have seen and examined Raiford Noble  in concert with Lafayette Dragon, MS4. The above note, originally authored by [employer], has been modified as necessary by me to reflect my own, personal collection/validation of the patient's HPI, medical and surgical history, physical examination, and assessment and plan.    Wilhemina Cash, MD  Electronically signed by Bennie Dallas, MD at 08/03/2022  4:43 PM EST

## 2022-07-30 NOTE — Telephone Encounter (Signed)
Formatting of this note is different from the original.  Left VM informing pt that lisinopril has been sent to her [facility] today.     Electronically signed by Shella Maxim, LPN at 82/50/5397  6:73 PM EST

## 2022-07-30 NOTE — Telephone Encounter (Signed)
Formatting of this note is different from the original.  Medication Request   Med 1 Med 2 (optional) Med 3 (optional)   What is the name of the medication? Lisinopril      How often is it taken? 1 a day     What is the dosage? 20 mg     Are you currently out? yes       Which pharmacy would you like to use?    The preferred Pharmacies we have on file for you:   [facility] [place], [place]  Phone: [phone] Fax: [phone]     (If no Pharmacy is shown or patient would like to use a different Pharmacy, please enter Name and Location)    Please note that medication requests can take up to 48 hours.      Electronically signed by Malon Kindle at 07/30/2022 12:25 PM EST

## 2022-08-04 NOTE — Telephone Encounter (Signed)
Formatting of this note might be different from the original.  Pt is returning a missed call from the clinic please advise the pt about this   Electronically signed by Elyse Jarvis at 08/04/2022  2:06 PM EST

## 2022-08-05 NOTE — Telephone Encounter (Signed)
Formatting of this note might be different from the original.  Tried reaching patient again with assistance of [employer], but call went to [employer].  Electronically signed by Shella Maxim, LPN at 69/62/9528  4:13 PM EST

## 2022-08-05 NOTE — Telephone Encounter (Signed)
Formatting of this note might be different from the original.    Results  Information obtained from caller:    What type of test: Blood work, if cannot reach patient please leave a Vm with results she gave the okay for this   What was the approximate date of service: 1/19   Was test done at [facility], if not - where was it done? yes    Electronically signed by Monica Becton at 08/05/2022  3:26 PM EST

## 2022-08-06 NOTE — Telephone Encounter (Signed)
Formatting of this note might be different from the original.  Per patient's request, voicemail with results has been left for her with assistance of [employer].  Pt was informed to call back to clinic with any questions or concerns.    Shella Maxim, LPN  Electronically signed by Shella Maxim, LPN at 00/92/3300 76:22 AM EST

## 2022-09-24 NOTE — Telephone Encounter (Signed)
Formatting of this note might be different from the original.  Prior mammogram images have been requested.  Electronically signed by Aura Fey at 09/24/2022 12:20 PM EDT

## 2022-10-02 NOTE — Telephone Encounter (Signed)
Formatting of this note might be different from the original.  I left voicemail for patient to callback.  Need to schedule mammogram addt'l views.  Please transfer [phone].  Electronically signed by Ashley Mariner Mellott at 10/02/2022 11:40 AM EDT
